# Patient Record
Sex: Female | Born: 1990 | Race: Black or African American | Hispanic: No | Marital: Single | Smoking: Never smoker
Health system: Southern US, Community
[De-identification: ages and names within clinical notes are randomized; demographics above are authoritative.]

## PROBLEM LIST (undated history)

## (undated) DIAGNOSIS — K589 Irritable bowel syndrome without diarrhea: Secondary | ICD-10-CM

---

## 2014-01-22 ENCOUNTER — Encounter (HOSPITAL_COMMUNITY): Payer: Self-pay | Admitting: Emergency Medicine

## 2014-01-22 ENCOUNTER — Emergency Department (HOSPITAL_COMMUNITY)
Admission: EM | Admit: 2014-01-22 | Discharge: 2014-01-22 | Disposition: A | Discharge status: Home / Self Care | Payer: BC Managed Care – PPO | Attending: Emergency Medicine | Admitting: Emergency Medicine

## 2014-01-22 ENCOUNTER — Other Ambulatory Visit: Payer: Self-pay

## 2014-01-22 DIAGNOSIS — R42 Dizziness and giddiness: Secondary | ICD-10-CM | POA: Diagnosis not present

## 2014-01-22 DIAGNOSIS — Z3202 Encounter for pregnancy test, result negative: Secondary | ICD-10-CM | POA: Insufficient documentation

## 2014-01-22 DIAGNOSIS — R110 Nausea: Secondary | ICD-10-CM | POA: Diagnosis not present

## 2014-01-22 DIAGNOSIS — R531 Weakness: Secondary | ICD-10-CM | POA: Insufficient documentation

## 2014-01-22 DIAGNOSIS — R51 Headache: Secondary | ICD-10-CM | POA: Diagnosis not present

## 2014-01-22 DIAGNOSIS — R55 Syncope and collapse: Secondary | ICD-10-CM | POA: Insufficient documentation

## 2014-01-22 LAB — I-STAT CHEM 8, ED
BUN: 14 mg/dL (ref 6–23)
Calcium, Ion: 1.2 mmol/L (ref 1.12–1.23)
Chloride: 104 mEq/L (ref 96–112)
Creatinine, Ser: 1 mg/dL (ref 0.50–1.10)
Glucose, Bld: 112 mg/dL — ABNORMAL HIGH (ref 70–99)
HCT: 40 % (ref 36.0–46.0)
Hemoglobin: 13.6 g/dL (ref 12.0–15.0)
Potassium: 3.8 mEq/L (ref 3.7–5.3)
Sodium: 140 mEq/L (ref 137–147)
TCO2: 24 mmol/L (ref 0–100)

## 2014-01-22 LAB — URINALYSIS, ROUTINE W REFLEX MICROSCOPIC
BILIRUBIN URINE: NEGATIVE
Glucose, UA: NEGATIVE mg/dL
Hgb urine dipstick: NEGATIVE
KETONES UR: NEGATIVE mg/dL
Leukocytes, UA: NEGATIVE
NITRITE: NEGATIVE
PH: 5.5 (ref 5.0–8.0)
Protein, ur: 100 mg/dL — AB
Specific Gravity, Urine: 1.029 (ref 1.005–1.030)
Urobilinogen, UA: 0.2 mg/dL (ref 0.0–1.0)

## 2014-01-22 LAB — URINE MICROSCOPIC-ADD ON

## 2014-01-22 LAB — POC URINE PREG, ED: Preg Test, Ur: NEGATIVE

## 2014-01-22 MED ORDER — SODIUM CHLORIDE 0.9 % IV BOLUS (SEPSIS)
1000.0000 mL | Freq: Once | INTRAVENOUS | Status: AC
  Administered 2014-01-22: 1000 mL via INTRAVENOUS

## 2014-01-22 MED ORDER — ONDANSETRON HCL 4 MG/2ML IJ SOLN
4.0000 mg | Freq: Once | INTRAMUSCULAR | Status: AC
  Administered 2014-01-22: 4 mg via INTRAVENOUS
  Filled 2014-01-22: qty 2

## 2014-01-22 MED ORDER — KETOROLAC TROMETHAMINE 30 MG/ML IJ SOLN
30.0000 mg | Freq: Once | INTRAMUSCULAR | Status: AC
  Administered 2014-01-22: 30 mg via INTRAVENOUS
  Filled 2014-01-22: qty 1

## 2014-01-22 NOTE — ED Notes (Signed)
Per EMS-Pt c/o dizziness near syncope at work today. States that she had the same symptoms x1 month. Home pregnancy test negative. Nausea no vomiting.

## 2014-01-22 NOTE — Discharge Instructions (Signed)
Near-Syncope Near-syncope (commonly known as near fainting) is sudden weakness, dizziness, or feeling like you might pass out. During an episode of near-syncope, you may also develop pale skin, have tunnel vision, or feel sick to your stomach (nauseous). Near-syncope may occur when getting up after sitting or while standing for a long time. It is caused by a sudden decrease in blood flow to the brain. This decrease can result from various causes or triggers, most of which are not serious. However, because near-syncope can sometimes be a sign of something serious, a medical evaluation is required. The specific cause is often not determined. HOME CARE INSTRUCTIONS  Monitor your condition for any changes. The following actions may help to alleviate any discomfort you are experiencing:  Have someone stay with you until you feel stable.  Lie down right away and prop your feet up if you start feeling like you might faint. Breathe deeply and steadily. Wait until all the symptoms have passed. Most of these episodes last only a few minutes. You may feel tired for several hours.   Drink enough fluids to keep your urine clear or pale yellow.   If you are taking blood pressure or heart medicine, get up slowly when seated or lying down. Take several minutes to sit and then stand. This can reduce dizziness.  Follow up with your health care provider as directed. SEEK IMMEDIATE MEDICAL CARE IF:   You have a severe headache.   You have unusual pain in the chest, abdomen, or back.   You are bleeding from the mouth or rectum, or you have black or tarry stool.   You have an irregular or very fast heartbeat.   You have repeated fainting or have seizure-like jerking during an episode.   You faint when sitting or lying down.   You have confusion.   You have difficulty walking.   You have severe weakness.   You have vision problems.  MAKE SURE YOU:   Understand these instructions.  Will  watch your condition.  Will get help right away if you are not doing well or get worse. Document Released: 03/04/2005 Document Revised: 03/09/2013 Document Reviewed: 08/07/2012 [employer] Patient Information 2015 [employer]. This information is not intended to replace advice given to you by your health care provider. Make sure you discuss any questions you have with your health care provider.  

## 2014-01-22 NOTE — ED Notes (Signed)
Bed: [id number] Expected date: 01/22/14 Expected time:  Means of arrival: Ambulance Comments: Weakenss; dizziness

## 2014-01-22 NOTE — ED Provider Notes (Signed)
CSN: [id number]     Arrival date & time 01/22/14  1718 History   First MD Initiated Contact with Patient 01/22/14 1731     Chief Complaint  Patient presents with  . Near Syncope     (Consider location/radiation/quality/duration/timing/severity/associated sxs/prior Treatment) HPI Pt is a 23yo female presenting to ED with c/o near syncope earlier today while she was at work. 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 Reports associated mild right sided aching headache, 4/10 at this time with associated nausea but no vomiting, fever or chills. Reports hx of similar episode last month but was not evaluated at that time. States she has not f/u with a PCP since the summer. States has naxplanon and does not normally have a menstrual cycle but did have one for 7 days last week which was "lighter than my cycles prior to being on naxplanon." denies hx of seizures, anemia or DM. Denies known heart problems including arrhythmias.    Pt denies new stress. States she has been eating and drinking well.  Pt does state she has never been a good sleeper and unsure how much sleep she gets at night. Denies use of caffeine, etoh, or recreational drugs.  History reviewed. No pertinent past medical history. History reviewed. No pertinent past surgical history. No family history on file. History  Substance Use Topics  . Smoking status: Never Smoker   . Smokeless tobacco: Not on file  . Alcohol Use: Yes     Comment: socially   OB History    No data available     Review of Systems  Constitutional: Negative for fever and chills.  Respiratory: Negative for cough and shortness of breath.   Cardiovascular: Negative for chest pain, palpitations and leg swelling.  Gastrointestinal: Negative for nausea, vomiting, abdominal pain and diarrhea.  Genitourinary: Negative for dysuria, hematuria, decreased urine volume,  vaginal bleeding, vaginal discharge, vaginal pain, menstrual problem and pelvic pain.  Musculoskeletal: Negative for myalgias and back pain.  Neurological: Positive for syncope ( near-syncope), weakness ( generalized), light-headedness and headaches. Negative for dizziness, seizures, facial asymmetry, speech difficulty and numbness.  All other systems reviewed and are negative.     Allergies  Review of patient's allergies indicates no known allergies.  Home Medications   Prior to Admission medications   Medication Sig Start Date End Date Taking? Authorizing Provider  Cholecalciferol (VITAMIN D PO) Take 1 capsule by mouth every 7 (seven) days.   Yes Historical Provider, MD  etonogestrel (NEXPLANON) 68 MG IMPL implant 1 each by Subdermal route once.   Yes Historical Provider, MD   BP 108/68 mmHg  Pulse 86  Temp(Src) 98.5 F (36.9 C) (Oral)  Resp 18  SpO2 100% Physical Exam  Constitutional: She is oriented to person, place, and time. She appears well-developed and well-nourished. No distress.  Pt sitting in exam bed, appears fatigued. NAD  HENT:  Head: Normocephalic and atraumatic.  Eyes: Conjunctivae and EOM are normal. Pupils are equal, round, and reactive to light. No scleral icterus.  Neck: Normal range of motion.  Cardiovascular: Normal rate, regular rhythm and normal heart sounds.   Regular rate and rhythm  Pulmonary/Chest: Effort normal and breath sounds normal. No respiratory distress. She has no wheezes. She has no rales. She exhibits no tenderness.  Abdominal: Soft. Bowel sounds are normal. She exhibits no distension and no mass. There is no tenderness.  There is no rebound and no guarding.  Musculoskeletal: Normal range of motion.  Neurological: She is alert and oriented to person, place, and time. She has normal strength. No cranial nerve deficit or sensory deficit. She displays a negative Romberg sign. Coordination and gait normal. GCS eye subscore is 4. GCS verbal  subscore is 5. GCS motor subscore is 6.  CN II-XII grossly in tact. Alert and oriented to person, place, and time. 5/5 strength in upper and lower extremities bilaterally. Normal finger to nose coordination. Normal gait.   Skin: Skin is warm and dry. She is not diaphoretic.  Nursing note and vitals reviewed.   ED Course  Procedures (including critical care time) Labs Review Labs Reviewed  URINALYSIS, ROUTINE W REFLEX MICROSCOPIC - Abnormal; Notable for the following:    Color, Urine AMBER (*)    Protein, ur 100 (*)    All other components within normal limits  I-STAT CHEM 8, ED - Abnormal; Notable for the following:    Glucose, Bld 112 (*)    All other components within normal limits  URINE MICROSCOPIC-ADD ON  POC URINE PREG, ED    Imaging Review No results found.   EKG Interpretation   Date/Time:  Saturday January 22 2014 17:16:58 EST Ventricular Rate:  78 PR Interval:  162 QRS Duration: 71 QT Interval:  366 QTC Calculation: 417 R Axis:   35 Text Interpretation:  Sinus rhythm Abnormal R-wave progression, early  transition No old tracing to compare Confirmed by KNAPP  MD-J, JON ([id number])  on 01/22/2014 7:07:01 PM      MDM   Final diagnoses:  Near syncope    Pt presenting to ED with c/o near-syncope episodes earlier today at work. Reports hx of similar episode w/o medical evaluation of previous episode.  Denies chest pain, palpitations or SOB. Reports headache. No respiratory distress, heart- regular rate and rhythm.  Pt appears well, non-toxic. Vitals: WNL.  Tx in ED: fluids, toradol, and zofran. HA improved from 10/10 to 4/10.  Labs: unremarakble. No evidence of anemia or electrolyte imbalance. Not concerned for [facility], CVA, arrhythmia, seizure, or other emergent process taking place at this time. Pt appears well. Will discharge home.     Junius Finnerrin O'Malley, PA-C 01/22/14 2014  Linwood DibblesJon Knapp, MD 01/23/14 [phone]

## 2014-03-04 ENCOUNTER — Ambulatory Visit (INDEPENDENT_AMBULATORY_CARE_PROVIDER_SITE_OTHER): Payer: BC Managed Care – PPO | Admitting: Physician Assistant

## 2014-03-04 VITALS — BP 110/78 | HR 94 | Temp 98.6°F | Resp 16 | Ht 64.0 in | Wt 146.8 lb

## 2014-03-04 DIAGNOSIS — R300 Dysuria: Secondary | ICD-10-CM

## 2014-03-04 LAB — POCT WET PREP WITH KOH
CLUE CELLS WET PREP PER HPF POC: NEGATIVE
KOH Prep POC: NEGATIVE
TRICHOMONAS UA: NEGATIVE
YEAST WET PREP PER HPF POC: NEGATIVE

## 2014-03-04 LAB — POCT UA - MICROSCOPIC ONLY
Casts, Ur, LPF, POC: NEGATIVE
Crystals, Ur, HPF, POC: NEGATIVE
Mucus, UA: NEGATIVE
Yeast, UA: NEGATIVE

## 2014-03-04 LAB — POCT URINALYSIS DIPSTICK
Bilirubin, UA: NEGATIVE
GLUCOSE UA: NEGATIVE
Ketones, UA: NEGATIVE
NITRITE UA: NEGATIVE
Protein, UA: NEGATIVE
Spec Grav, UA: <=1.005
Urobilinogen, UA: 0.2
pH, UA: 5.5

## 2014-03-04 MED ORDER — PHENAZOPYRIDINE HCL 200 MG PO TABS: 200.0000 mg | ORAL_TABLET | Freq: Three times a day (TID) | ORAL | Status: DC | PRN

## 2014-03-04 NOTE — Progress Notes (Signed)
Subjective:    Patient ID: Molly Schultz, female    DOB: 07-31-1990, 23 y.o.   MRN: [id number]  HPI Patient presents for 4 day of burning and pain with urination. Drinks a lot of water so does not think there is increased frequency, however, complains of urgency and incomplete emptying. Denies fever, back/flank/abdomenal pain, but has pain near urethra without urination. Is sexually active with monogamous female partner and do not use condoms. Denies dyspareunia. Was checked for STDs 2 months ago and does not wish to have testing today. Female partner was tested at that time. Has normal pap this past summer and does not want a pelvic exam at this time. LMP 02/26/14 and was regular. Denies N/V or D/C. Thought she had a yeast infection initially and has been taking Monistat 3 day and has completed regimen. Taking Azo and drinking cranberry without improvement of sx.  No med allergies.  Review of Systems  Constitutional: Negative for fever and chills.  Gastrointestinal: Negative for nausea, vomiting, abdominal pain, diarrhea and constipation.  Genitourinary: Positive for dysuria, urgency and difficulty urinating. Negative for frequency, hematuria, flank pain, decreased urine volume, vaginal bleeding, vaginal discharge, vaginal pain, menstrual problem, pelvic pain and dyspareunia.  Musculoskeletal: Negative for back pain.  Allergic/Immunologic: Negative for environmental allergies and food allergies.       Objective:   Physical Exam  Constitutional: She is oriented to person, place, and time. She appears well-developed and well-nourished. No distress.  Blood pressure 110/78, pulse 94, temperature 98.6 F (37 C), temperature source Oral, resp. rate 16, height 5\' 4"  (1.626 m), weight 146 lb 12.8 oz (66.588 kg), last menstrual period 03/04/2014, SpO2 100 %.  HENT:  Head: Normocephalic and atraumatic.  Right Ear: External ear normal.  Left Ear: External ear normal.  Cardiovascular: Normal rate,  regular rhythm and normal heart sounds.  Exam reveals no gallop and no friction rub.   No murmur heard. Pulmonary/Chest: Effort normal and breath sounds normal. She has no wheezes. She has no rales.  Abdominal: Soft. Bowel sounds are normal. She exhibits no distension and no mass. There is no tenderness. There is no rebound, no guarding and no CVA tenderness. No hernia.  Neurological: She is alert and oriented to person, place, and time.  Skin: Skin is warm and dry. No rash noted. She is not diaphoretic. No erythema. No pallor.   Results for orders placed or performed in visit on 03/04/14  POCT urinalysis dipstick  Result Value Ref Range   Color, UA light yellow    Clarity, UA slightly cloudy    Glucose, UA neg    Bilirubin, UA neg    Ketones, UA neg    Spec Grav, UA <=1.005    Blood, UA small    pH, UA 5.5    Protein, UA neg    Urobilinogen, UA 0.2    Nitrite, UA neg    Leukocytes, UA moderate (2+)   POCT UA - Microscopic Only  Result Value Ref Range   WBC, Ur, HPF, POC 0-3    RBC, urine, microscopic 1-4    Bacteria, U Microscopic trace    Mucus, UA neg    Epithelial cells, urine per micros 0-2    Crystals, Ur, HPF, POC neg    Casts, Ur, LPF, POC neg    Yeast, UA neg   POCT Wet Prep with KOH  Result Value Ref Range   Trichomonas, UA Negative    Clue Cells Wet Prep HPF POC neg  Epithelial Wet Prep HPF POC 3-12    Yeast Wet Prep HPF POC neg    Bacteria Wet Prep HPF POC trace    RBC Wet Prep HPF POC 3-6    WBC Wet Prep HPF POC 0-4    KOH Prep POC Negative        Assessment & Plan:  1. Dysuria Wet prep and UA neg. Will just tx sx until urine culture returns. - POCT urinalysis dipstick - POCT UA - Microscopic Only - POCT Wet Prep with KOH - Urine culture - phenazopyridine (PYRIDIUM) 200 MG tablet; Take 1 tablet (200 mg total) by mouth 3 (three) times daily as needed for pain.  Dispense: 10 tablet; Refill: 0   Amberleigh Gerken PA-C  [facility] 03/04/2014 7:01 PM

## 2014-03-05 ENCOUNTER — Telehealth: Payer: Self-pay

## 2014-03-05 NOTE — Telephone Encounter (Signed)
Patient called stated she was seen yesterday. Patient is requesting for an antibiotic for her UTI. She is having pain and discomfort. She feel the pain is getting worse. [facility] [address] Please contact patient at [phone]. Patient stated the number we have on file is disconnected for right now.

## 2014-03-05 NOTE — Progress Notes (Signed)
Tishira- I would likely go ahead and start this pt on tx for UTI while her culture is pending.  Her urine is quite dilute so the UA and micro may not be as helpful but she does have nitrites and some white/ red cells.   It is your call but at the least be sure to follow-up with her closely with her culture results and maybe check on her tomorrow.   Thanks! JC

## 2014-03-06 NOTE — Telephone Encounter (Signed)
Wants us to know that the pain getting worse

## 2014-03-07 MED ORDER — SULFAMETHOXAZOLE-TRIMETHOPRIM 800-160 MG PO TABS: 1.0000 | ORAL_TABLET | Freq: Two times a day (BID) | ORAL | Status: DC

## 2014-03-07 NOTE — Telephone Encounter (Signed)
Pt requesting medication for pain and discomfort. Please advise

## 2014-03-07 NOTE — Telephone Encounter (Signed)
Urine culture confirms UTI, but sensitivities are not yet available. Start empiric therapy with Septra DS.  Meds ordered this encounter  Medications  . sulfamethoxazole-trimethoprim (BACTRIM DS,SEPTRA DS) 800-160 MG per tablet    Sig: Take 1 tablet by mouth 2 (two) times daily.    Dispense:  10 tablet    Refill:  0    Order Specific Question:  Supervising Provider    Answer:  DOOLITTLE, ROBERT P [[id number]]

## 2014-03-08 LAB — URINE CULTURE: Colony Count: >=100000

## 2014-03-08 NOTE — Telephone Encounter (Signed)
LM on cell Abx sent to pharmacy.

## 2014-03-08 NOTE — Telephone Encounter (Signed)
Pt had called back, didn't get her message, and LM on VM to call her back. I called pt and advised Rx is at [facility] [address].

## 2014-03-12 ENCOUNTER — Telehealth: Payer: Self-pay

## 2014-03-12 NOTE — Telephone Encounter (Signed)
Pt was prescribed bactrim 12/18 and is still not feeling any better. She wants to know what can be done about her persistent symptoms.

## 2014-03-15 NOTE — Telephone Encounter (Signed)
Pt needs to RTC Tried to reach pt- phone is disconnected. Please get number for pt when call is rtn

## 2014-03-15 NOTE — Telephone Encounter (Signed)
Pt needs to RTC.

## 2014-08-18 ENCOUNTER — Ambulatory Visit: Payer: BLUE CROSS/BLUE SHIELD

## 2014-10-13 ENCOUNTER — Emergency Department (HOSPITAL_COMMUNITY)
Admission: EM | Admit: 2014-10-13 | Discharge: 2014-10-13 | Disposition: A | Discharge status: Home / Self Care | Payer: BLUE CROSS/BLUE SHIELD | Attending: Emergency Medicine | Admitting: Emergency Medicine

## 2014-10-13 ENCOUNTER — Emergency Department (HOSPITAL_COMMUNITY): Payer: BLUE CROSS/BLUE SHIELD

## 2014-10-13 ENCOUNTER — Encounter (HOSPITAL_COMMUNITY): Payer: Self-pay | Admitting: Neurology

## 2014-10-13 DIAGNOSIS — S0990XA Unspecified injury of head, initial encounter: Secondary | ICD-10-CM

## 2014-10-13 DIAGNOSIS — Y9389 Activity, other specified: Secondary | ICD-10-CM | POA: Insufficient documentation

## 2014-10-13 DIAGNOSIS — S060X0A Concussion without loss of consciousness, initial encounter: Secondary | ICD-10-CM | POA: Insufficient documentation

## 2014-10-13 DIAGNOSIS — R112 Nausea with vomiting, unspecified: Secondary | ICD-10-CM | POA: Insufficient documentation

## 2014-10-13 DIAGNOSIS — Y998 Other external cause status: Secondary | ICD-10-CM | POA: Insufficient documentation

## 2014-10-13 DIAGNOSIS — W01198A Fall on same level from slipping, tripping and stumbling with subsequent striking against other object, initial encounter: Secondary | ICD-10-CM | POA: Diagnosis not present

## 2014-10-13 DIAGNOSIS — Z792 Long term (current) use of antibiotics: Secondary | ICD-10-CM | POA: Insufficient documentation

## 2014-10-13 DIAGNOSIS — Y92009 Unspecified place in unspecified non-institutional (private) residence as the place of occurrence of the external cause: Secondary | ICD-10-CM | POA: Insufficient documentation

## 2014-10-13 MED ORDER — KETOROLAC TROMETHAMINE 30 MG/ML IJ SOLN
30.0000 mg | Freq: Once | INTRAMUSCULAR | Status: AC
  Administered 2014-10-13: 30 mg via INTRAVENOUS
  Filled 2014-10-13: qty 1

## 2014-10-13 MED ORDER — FENTANYL CITRATE (PF) 100 MCG/2ML IJ SOLN
25.0000 ug | Freq: Once | INTRAMUSCULAR | Status: AC
  Administered 2014-10-13: 25 ug via INTRAVENOUS
  Filled 2014-10-13: qty 2

## 2014-10-13 MED ORDER — ONDANSETRON HCL 4 MG/2ML IJ SOLN
4.0000 mg | Freq: Once | INTRAMUSCULAR | Status: AC
  Administered 2014-10-13: 4 mg via INTRAVENOUS
  Filled 2014-10-13: qty 2

## 2014-10-13 MED ORDER — TRAMADOL HCL 50 MG PO TABS: 50.0000 mg | ORAL_TABLET | Freq: Four times a day (QID) | ORAL | Status: DC | PRN

## 2014-10-13 MED ORDER — ONDANSETRON HCL 4 MG PO TABS: 4.0000 mg | ORAL_TABLET | Freq: Three times a day (TID) | ORAL | Status: DC | PRN

## 2014-10-13 NOTE — Discharge Instructions (Signed)
Concussion  A concussion, or closed-head injury, is a brain injury caused by a direct blow to the head or by a quick and sudden movement (jolt) of the head or neck. Concussions are usually not life-threatening. Even so, the effects of a concussion can be serious. If you have had a concussion before, you are more likely to experience concussion-like symptoms after a direct blow to the head.   CAUSES  · Direct blow to the head, such as from running into another player during a soccer game, being hit in a fight, or hitting your head on a hard surface.  · A jolt of the head or neck that causes the brain to move back and forth inside the skull, such as in a car crash.  SIGNS AND SYMPTOMS  The signs of a concussion can be hard to notice. Early on, they may be missed by you, family members, and health care providers. You may look fine but act or feel differently.  Symptoms are usually temporary, but they may last for days, weeks, or even longer. Some symptoms may appear right away while others may not show up for hours or days. Every head injury is different. Symptoms include:  · Mild to moderate headaches that will not go away.  · A feeling of pressure inside your head.  · Having more trouble than usual:  ¨ Learning or remembering things you have heard.  ¨ Answering questions.  ¨ Paying attention or concentrating.  ¨ Organizing daily tasks.  ¨ Making decisions and solving problems.  · Slowness in thinking, acting or reacting, speaking, or reading.  · Getting lost or being easily confused.  · Feeling tired all the time or lacking energy (fatigued).  · Feeling drowsy.  · Sleep disturbances.  ¨ Sleeping more than usual.  ¨ Sleeping less than usual.  ¨ Trouble falling asleep.  ¨ Trouble sleeping (insomnia).  · Loss of balance or feeling lightheaded or dizzy.  · Nausea or vomiting.  · Numbness or tingling.  · Increased sensitivity to:  ¨ Sounds.  ¨ Lights.  ¨ Distractions.  · Vision problems or eyes that tire  easily.  · Diminished sense of taste or smell.  · Ringing in the ears.  · Mood changes such as feeling sad or anxious.  · Becoming easily irritated or angry for little or no reason.  · Lack of motivation.  · Seeing or hearing things other people do not see or hear (hallucinations).  DIAGNOSIS  Your health care provider can usually diagnose a concussion based on a description of your injury and symptoms. He or she will ask whether you passed out (lost consciousness) and whether you are having trouble remembering events that happened right before and during your injury.  Your evaluation might include:  · A brain scan to look for signs of injury to the brain. Even if the test shows no injury, you may still have a concussion.  · Blood tests to be sure other problems are not present.  TREATMENT  · Concussions are usually treated in an emergency department, in urgent care, or at a clinic. You may need to stay in the hospital overnight for further treatment.  · Tell your health care provider if you are taking any medicines, including prescription medicines, over-the-counter medicines, and natural remedies. Some medicines, such as blood thinners (anticoagulants) and aspirin, may increase the chance of complications. Also tell your health care provider whether you have had alcohol or are taking illegal drugs. This information   may affect treatment.  · Your health care provider will send you home with important instructions to follow.  · How fast you will recover from a concussion depends on many factors. These factors include how severe your concussion is, what part of your brain was injured, your age, and how healthy you were before the concussion.  · Most people with mild injuries recover fully. Recovery can take time. In general, recovery is slower in older persons. Also, persons who have had a concussion in the past or have other medical problems may find that it takes longer to recover from their current injury.  HOME  CARE INSTRUCTIONS  General Instructions  · Carefully follow the directions your health care provider gave you.  · Only take over-the-counter or prescription medicines for pain, discomfort, or fever as directed by your health care provider.  · Take only those medicines that your health care provider has approved.  · Do not drink alcohol until your health care provider says you are well enough to do so. Alcohol and certain other drugs may slow your recovery and can put you at risk of further injury.  · If it is harder than usual to remember things, write them down.  · If you are easily distracted, try to do one thing at a time. For example, do not try to watch TV while fixing dinner.  · Talk with family members or close friends when making important decisions.  · Keep all follow-up appointments. Repeated evaluation of your symptoms is recommended for your recovery.  · Watch your symptoms and tell others to do the same. Complications sometimes occur after a concussion. Older adults with a brain injury may have a higher risk of serious complications, such as a blood clot on the brain.  · Tell your teachers, school nurse, school counselor, coach, athletic trainer, or work manager about your injury, symptoms, and restrictions. Tell them about what you can or cannot do. They should watch for:  ¨ Increased problems with attention or concentration.  ¨ Increased difficulty remembering or learning new information.  ¨ Increased time needed to complete tasks or assignments.  ¨ Increased irritability or decreased ability to cope with stress.  ¨ Increased symptoms.  · Rest. Rest helps the brain to heal. Make sure you:  ¨ Get plenty of sleep at night. Avoid staying up late at night.  ¨ Keep the same bedtime hours on weekends and weekdays.  ¨ Rest during the day. Take daytime naps or rest breaks when you feel tired.  · Limit activities that require a lot of thought or concentration. These include:  ¨ Doing homework or job-related  work.  ¨ Watching TV.  ¨ Working on the computer.  · Avoid any situation where there is potential for another head injury (football, hockey, soccer, basketball, martial arts, downhill snow sports and horseback riding). Your condition will get worse every time you experience a concussion. You should avoid these activities until you are evaluated by the appropriate follow-up health care providers.  Returning To Your Regular Activities  You will need to return to your normal activities slowly, not all at once. You must give your body and brain enough time for recovery.  · Do not return to sports or other athletic activities until your health care provider tells you it is safe to do so.  · Ask your health care provider when you can drive, ride a bicycle, or operate heavy machinery. Your ability to react may be slower after a   brain injury. Never do these activities if you are dizzy.  · Ask your health care provider about when you can return to work or school.  Preventing Another Concussion  It is very important to avoid another brain injury, especially before you have recovered. In rare cases, another injury can lead to permanent brain damage, brain swelling, or death. The risk of this is greatest during the first 7-10 days after a head injury. Avoid injuries by:  · Wearing a seat belt when riding in a car.  · Drinking alcohol only in moderation.  · Wearing a helmet when biking, skiing, skateboarding, skating, or doing similar activities.  · Avoiding activities that could lead to a second concussion, such as contact or recreational sports, until your health care provider says it is okay.  · Taking safety measures in your home.  ¨ Remove clutter and tripping hazards from floors and stairways.  ¨ Use grab bars in bathrooms and handrails by stairs.  ¨ Place non-slip mats on floors and in bathtubs.  ¨ Improve lighting in dim areas.  SEEK MEDICAL CARE IF:  · You have increased problems paying attention or  concentrating.  · You have increased difficulty remembering or learning new information.  · You need more time to complete tasks or assignments than before.  · You have increased irritability or decreased ability to cope with stress.  · You have more symptoms than before.  Seek medical care if you have any of the following symptoms for more than 2 weeks after your injury:  · Lasting (chronic) headaches.  · Dizziness or balance problems.  · Nausea.  · Vision problems.  · Increased sensitivity to noise or light.  · Depression or mood swings.  · Anxiety or irritability.  · Memory problems.  · Difficulty concentrating or paying attention.  · Sleep problems.  · Feeling tired all the time.  SEEK IMMEDIATE MEDICAL CARE IF:  · You have severe or worsening headaches. These may be a sign of a blood clot in the brain.  · You have weakness (even if only in one hand, leg, or part of the face).  · You have numbness.  · You have decreased coordination.  · You vomit repeatedly.  · You have increased sleepiness.  · One pupil is larger than the other.  · You have convulsions.  · You have slurred speech.  · You have increased confusion. This may be a sign of a blood clot in the brain.  · You have increased restlessness, agitation, or irritability.  · You are unable to recognize people or places.  · You have neck pain.  · It is difficult to wake you up.  · You have unusual behavior changes.  · You lose consciousness.  MAKE SURE YOU:  · Understand these instructions.  · Will watch your condition.  · Will get help right away if you are not doing well or get worse.  Document Released: 05/25/2003 Document Revised: 03/09/2013 Document Reviewed: 09/24/2012  [employer]® Patient Information ©2015 [employer]. This information is not intended to replace advice given to you by your health care provider. Make sure you discuss any questions you have with your health care provider.

## 2014-10-13 NOTE — ED Provider Notes (Signed)
CSN: [id number]     Arrival date & time 10/13/14  0910 History   First MD Initiated Contact with Patient 10/13/14 0919     Chief Complaint  Patient presents with  . Fall     (Consider location/radiation/quality/duration/timing/severity/associated sxs/prior Treatment) HPI Comments: Molly Schultz, 24 y/o female presents after a fall. She fell two evenings ago and complains of headache, dizziness, nausea, and vomiting. When she fell, she hit the right occipital region of her head on the wall and then fell to the ground. The headache began 30 minutes after the fall. The nausea and vomiting began yesterday. Her headache ranges from an 8/10 to currently a 5/10 and is waxing and waning. Her significant other states that she complains of headaches rather regularly. She had numbness and tingling of her lower extremities last night that resolved after she vomited.  She has a sore throat that began before she fell. She denies phonophobia, weakness, dysphagia, receptive or expressive aphasia, any other injuries, fevers, or chills.  Patient is a 24 y.o. female presenting with fall. The history is provided by the patient.  Fall This is a new problem. The current episode started in the past 7 days. Associated symptoms include headaches, nausea and vomiting. Pertinent negatives include no chills, fever, numbness, visual change or weakness.    History reviewed. No pertinent past medical history. History reviewed. No pertinent past surgical history. No family history on file. History  Substance Use Topics  . Smoking status: Never Smoker   . Smokeless tobacco: Never Used  . Alcohol Use: Yes     Comment: socially   OB History    No data available     Review of Systems  Constitutional: Negative for fever and chills.  Gastrointestinal: Positive for nausea and vomiting.  Neurological: Positive for headaches. Negative for weakness and numbness.  All other systems reviewed and are  negative.     Allergies  Review of patient's allergies indicates no known allergies.  Home Medications   Prior to Admission medications   Medication Sig Start Date End Date Taking? Authorizing Provider  Cholecalciferol (VITAMIN D PO) Take 1 capsule by mouth every 7 (seven) days.    Historical Provider, MD  etonogestrel (NEXPLANON) 68 MG IMPL implant 1 each by Subdermal route once.    Historical Provider, MD  phenazopyridine (PYRIDIUM) 200 MG tablet Take 1 tablet (200 mg total) by mouth 3 (three) times daily as needed for pain. 03/04/14   Tishira R Brewington, PA-C  sulfamethoxazole-trimethoprim (BACTRIM DS,SEPTRA DS) 800-160 MG per tablet Take 1 tablet by mouth 2 (two) times daily. 03/07/14   Chelle Jeffery, PA-C   BP 109/76 mmHg  Pulse 88  Temp(Src) 98.5 F (36.9 C) (Oral)  Resp 16  SpO2 100%  LMP 09/22/2014 Physical Exam  Constitutional: She is oriented to person, place, and time. She appears well-developed and well-nourished. No distress.  HENT:  Head: Normocephalic and atraumatic.  Mouth/Throat: Oropharynx is clear and moist.  Eyes: Conjunctivae and EOM are normal. Pupils are equal, round, and reactive to light. No scleral icterus.  No horizontal, vertical or rotational nystagmus  Neck: Normal range of motion. Neck supple.  Full active and passive ROM without pain No midline or paraspinal tenderness No nuchal rigidity or meningeal signs  Cardiovascular: Normal rate, regular rhythm and intact distal pulses.   Pulmonary/Chest: Effort normal and breath sounds normal. No respiratory distress. She has no wheezes. She has no rales.  Abdominal: Soft. Bowel sounds are normal. There is no tenderness.  There is no rebound and no guarding.  Musculoskeletal: Normal range of motion.  Lymphadenopathy:    She has no cervical adenopathy.  Neurological: She is alert and oriented to person, place, and time. She displays normal reflexes. No cranial nerve deficit. She exhibits normal muscle  tone. Coordination normal.  Mental Status:  Alert, oriented, thought content appropriate. Speech fluent without evidence of aphasia. Able to follow 2 step commands without difficulty.  Cranial Nerves:  II:  Peripheral visual fields grossly normal, pupils equal, round, reactive to light III,IV, VI: ptosis not present, extra-ocular motions intact bilaterally  V,VII: smile symmetric, facial light touch sensation equal VIII: hearing grossly normal bilaterally  IX,X: gag reflex present  XI: bilateral shoulder shrug equal and strong XII: midline tongue extension  Motor:  5/5 in upper and lower extremities bilaterally including strong and equal grip strength and dorsiflexion/plantar flexion Sensory: Pinprick and light touch normal in all extremities.  Deep Tendon Reflexes: 2+ and symmetric  Cerebellar: normal finger-to-nose with bilateral upper extremities Gait: normal gait and balance CV: distal pulses palpable throughout   Skin: Skin is warm and dry. No rash noted. She is not diaphoretic.  Psychiatric: She has a normal mood and affect. Her behavior is normal. Judgment and thought content normal.  Nursing note and vitals reviewed.   ED Course  Procedures (including critical care time) Labs Review Labs Reviewed - No data to display  Imaging Review No results found.   EKG Interpretation None      MDM   Final diagnoses:  Head injury    10:43 AM BP 102/76 mmHg  Pulse 77  Temp(Src) 98.5 F (36.9 C) (Oral)  Resp 16  SpO2 99%  LMP 09/22/2014 Patient CT scan negative for acute intracranial abnormality. Her symptoms likely reflect concussion. Patient will be discharged with tramadol and Zofran.s. Pt is afebrile with no focal neuro deficits, nuchal rigidity, or change in vision. Pt is to follow up with PCP. Pt verbalizes understanding and is agreeable with plan to dc.    Arthor Captain, PA-C 10/13/14 1045  Nelva Nay, MD 10/13/14 [phone]

## 2014-10-13 NOTE — ED Notes (Signed)
Per ems- pt comes from home on Tuesday she fell getting out of the shower and hit right side of her head. Since then she has felt dizzy and vomiting since yesterday. Has c-collar in place. Given 4 mg zofran. BP 113/78, HR 90 SR, CBG 80. Is a x 4

## 2015-11-15 ENCOUNTER — Telehealth: Payer: Self-pay

## 2015-11-15 NOTE — Telephone Encounter (Signed)
Pt would like a CB concerning her nexplanon implant. She would like someone here to remove it. Please advise at [phone]

## 2015-11-16 ENCOUNTER — Encounter: Payer: Self-pay | Admitting: Physician Assistant

## 2015-11-16 ENCOUNTER — Ambulatory Visit (INDEPENDENT_AMBULATORY_CARE_PROVIDER_SITE_OTHER): Payer: PRIVATE HEALTH INSURANCE | Admitting: Physician Assistant

## 2015-11-16 VITALS — BP 104/80 | HR 92 | Temp 98.4°F | Resp 16 | Ht 64.0 in | Wt 155.2 lb

## 2015-11-16 DIAGNOSIS — N912 Amenorrhea, unspecified: Secondary | ICD-10-CM | POA: Diagnosis not present

## 2015-11-16 DIAGNOSIS — Z30011 Encounter for initial prescription of contraceptive pills: Secondary | ICD-10-CM | POA: Diagnosis not present

## 2015-11-16 DIAGNOSIS — Z3046 Encounter for surveillance of implantable subdermal contraceptive: Secondary | ICD-10-CM | POA: Diagnosis not present

## 2015-11-16 LAB — POCT URINE PREGNANCY: Preg Test, Ur: NEGATIVE

## 2015-11-16 MED ORDER — LEVONORGESTREL-ETHINYL ESTRAD 0.1-20 MG-MCG PO TABS
1.0000 | ORAL_TABLET | Freq: Every day | ORAL | 0 refills | Status: DC
Start: 2015-11-16 — End: 2017-06-03

## 2015-11-16 NOTE — Patient Instructions (Addendum)
  Recheck with me in a 2 week for a repeat pregnancy test wice we are doing quick start for pills without you being on your pill.   IF you received an x-ray today, [facility] will [facility]. Please contact [facility] at [phone] with questions or concerns regarding your invoice.   IF you received labwork today, you will receive an invoice from [facility]. Please contact [facility] at [phone] with questions or concerns regarding your invoice.   Our billing staff will not be able to assist you with questions regarding bills from these companies.  You will be contacted with the lab results as soon as they are available. The fastest way to get your results is to activate your My Chart account. Instructions are located on the last page of this paperwork. If you have not heard from us regarding the results in 2 weeks, please contact this office.

## 2015-11-16 NOTE — Progress Notes (Signed)
   Molly Schultz  MRN: [id number] DOB: 1990-10-23  Subjective:  Pt presents to clinic for nexplanon removal - it was due to come out 08/13/2015 - she had trouble with her insurance so she was unable to get it out.  She has been feeling terrible with nausea and upset stomach and she thinks it might be from the nexplanon.  She has been sexually active without other protection.  She would like to have another form of birth control but she does not want what she has tried in the past (Depoprovera and Nexplanon) she would like to try OCP -- she knows she wants to get pregnant but she is not sure when.  She is unsure when her last pap smear was. LMP 1.5 weeks ago  Review of Systems  Constitutional: Negative for chills and fever.    There are no active problems to display for this patient.   Current Outpatient Prescriptions on File Prior to Visit  Medication Sig Dispense Refill  . Cholecalciferol (VITAMIN D PO) Take 1 capsule by mouth every 7 (seven) days.    . ondansetron (ZOFRAN) 4 MG tablet Take 1 tablet (4 mg total) by mouth every 8 (eight) hours as needed for nausea or vomiting. (Patient not taking: Reported on 11/16/2015) 10 tablet 0  . phenazopyridine (PYRIDIUM) 200 MG tablet Take 1 tablet (200 mg total) by mouth 3 (three) times daily as needed for pain. (Patient not taking: Reported on 11/16/2015) 10 tablet 0  . traMADol (ULTRAM) 50 MG tablet Take 1 tablet (50 mg total) by mouth every 6 (six) hours as needed. (Patient not taking: Reported on 11/16/2015) 15 tablet 0   No current facility-administered medications on file prior to visit.     No Known Allergies  Pt patients past, family and social history were reviewed and updated.  Objective:  BP 104/80 (BP Location: Right Arm, Patient Position: Sitting, Cuff Size: Normal)   Pulse 92   Temp 98.4 F (36.9 C) (Oral)   Resp 16   Ht 5\' 4"  (1.626 m)   Wt 155 lb 3.2 oz (70.4 kg)   LMP 10/30/2015   SpO2 100%   BMI 26.64 kg/m   Physical  Exam  Constitutional: She is oriented to person, place, and time and well-developed, well-nourished, and in no distress.  HENT:  Head: Normocephalic and atraumatic.  Right Ear: Hearing and external ear normal.  Left Ear: Hearing and external ear normal.  Eyes: Conjunctivae are normal.  Neck: Normal range of motion.  Pulmonary/Chest: Effort normal.  Neurological: She is alert and oriented to person, place, and time. Gait normal.  Skin: Skin is warm and dry.  Psychiatric: Mood, memory, affect and judgment normal.  Vitals reviewed.  Procedure: Consent obtained.  Local anesthesia with 1% lido with epi.  Betadine prep.  #11 blade used to make a 1/2 cm incision and nexplanon removed without problems. Steristrip placed and pressure drsg.  Results for orders placed or performed in visit on 11/16/15  POCT urine pregnancy  Result Value Ref Range   Preg Test, Ur Negative Negative    Assessment and Plan :  Amenorrhea - Plan: POCT urine pregnancy  Encounter for initial prescription of contraceptive pills - Plan: levonorgestrel-ethinyl estradiol (AVIANE) 0.1-20 MG-MCG tablet - quick start today with OCP and recheck with me in 2 weeks to continue RF of OCP  Nexplanon removal - removed without problems  Benny LennertSarah Gaylin Osoria PA-C  [facility] 11/16/2015 7:01 PM

## 2015-11-17 NOTE — Telephone Encounter (Signed)
I know we implant them but I called pt and she states she doesn't have a question

## 2016-07-03 ENCOUNTER — Ambulatory Visit (INDEPENDENT_AMBULATORY_CARE_PROVIDER_SITE_OTHER): Payer: PRIVATE HEALTH INSURANCE | Admitting: Emergency Medicine

## 2016-07-03 VITALS — BP 110/71 | HR 89 | Temp 99.1°F | Resp 18 | Ht 64.0 in | Wt 146.0 lb

## 2016-07-03 DIAGNOSIS — R112 Nausea with vomiting, unspecified: Secondary | ICD-10-CM | POA: Diagnosis not present

## 2016-07-03 DIAGNOSIS — R1084 Generalized abdominal pain: Secondary | ICD-10-CM | POA: Diagnosis not present

## 2016-07-03 LAB — CBC WITH DIFFERENTIAL/PLATELET
Basophils Absolute: 0 10*3/uL (ref 0.0–0.2)
Basos: 0 %
EOS (ABSOLUTE): 0 10*3/uL (ref 0.0–0.4)
Eos: 0 %
HEMATOCRIT: 40.2 % (ref 34.0–46.6)
Hemoglobin: 12.6 g/dL (ref 11.1–15.9)
IMMATURE GRANS (ABS): 0 10*3/uL (ref 0.0–0.1)
Immature Granulocytes: 0 %
LYMPHS ABS: 1.7 10*3/uL (ref 0.7–3.1)
Lymphs: 24 %
MCH: 30.2 pg (ref 26.6–33.0)
MCHC: 31.3 g/dL — AB (ref 31.5–35.7)
MCV: 96 fL (ref 79–97)
Monocytes Absolute: 0.2 10*3/uL (ref 0.1–0.9)
Monocytes: 4 %
NEUTROS ABS: 4.9 10*3/uL (ref 1.4–7.0)
Neutrophils: 72 %
Platelets: 371 10*3/uL (ref 150–379)
RBC: 4.17 x10E6/uL (ref 3.77–5.28)
RDW: 14.3 % (ref 12.3–15.4)
WBC: 6.9 10*3/uL (ref 3.4–10.8)

## 2016-07-03 LAB — COMPREHENSIVE METABOLIC PANEL
ALT: 12 IU/L (ref 0–32)
AST: 14 IU/L (ref 0–40)
Albumin/Globulin Ratio: 1.8 (ref 1.2–2.2)
Albumin: 4.8 g/dL (ref 3.5–5.5)
Alkaline Phosphatase: 43 IU/L (ref 39–117)
BUN/Creatinine Ratio: 10 (ref 9–23)
BUN: 8 mg/dL (ref 6–20)
Bilirubin Total: 0.3 mg/dL (ref 0.0–1.2)
CO2: 35 mmol/L — ABNORMAL HIGH (ref 18–29)
Calcium: 9.7 mg/dL (ref 8.7–10.2)
Chloride: 100 mmol/L (ref 96–106)
Creatinine, Ser: 0.81 mg/dL (ref 0.57–1.00)
GFR calc non Af Amer: 102 mL/min/{1.73_m2} (ref >59)
GFR, EST AFRICAN AMERICAN: 118 mL/min/{1.73_m2} (ref >59)
GLOBULIN, TOTAL: 2.7 g/dL (ref 1.5–4.5)
Glucose: 93 mg/dL (ref 65–99)
POTASSIUM: 4.3 mmol/L (ref 3.5–5.2)
Sodium: 140 mmol/L (ref 134–144)
TOTAL PROTEIN: 7.5 g/dL (ref 6.0–8.5)

## 2016-07-03 LAB — POCT URINALYSIS DIP (MANUAL ENTRY)
BILIRUBIN UA: NEGATIVE mg/dL
Bilirubin, UA: NEGATIVE
Glucose, UA: NEGATIVE mg/dL
Leukocytes, UA: NEGATIVE
Nitrite, UA: NEGATIVE
PH UA: 6 (ref 5.0–8.0)
PROTEIN UA: NEGATIVE mg/dL
RBC UA: NEGATIVE
SPEC GRAV UA: 1.01 (ref 1.010–1.025)
Urobilinogen, UA: 0.2 E.U./dL

## 2016-07-03 LAB — POCT URINE PREGNANCY: PREG TEST UR: NEGATIVE

## 2016-07-03 MED ORDER — HYOSCYAMINE SULFATE 0.125 MG SL SUBL
0.1250 mg | SUBLINGUAL_TABLET | SUBLINGUAL | 0 refills | Status: AC | PRN
Start: 2016-07-03 — End: ?

## 2016-07-03 MED ORDER — ONDANSETRON HCL 4 MG PO TABS: 4.0000 mg | ORAL_TABLET | Freq: Three times a day (TID) | ORAL | 0 refills | Status: DC | PRN

## 2016-07-03 NOTE — Patient Instructions (Addendum)
IF you received an x-ray today, you will receive an invoice from [facility]. Please contact [facility] at [phone] with questions or concerns regarding your invoice.   IF you received labwork today, you will receive an invoice from [place]. Please contact [facility] at [phone] with questions or concerns regarding your invoice.   Our billing staff will not be able to assist you with questions regarding bills from these companies.  You will be contacted with the lab results as soon as they are available. The fastest way to get your results is to activate your My Chart account. Instructions are located on the last page of this paperwork. If you have not heard from [place] regarding the results in 2 weeks, please contact this office.     Nausea and Vomiting, Adult Feeling sick to your stomach (nausea) means that your stomach is upset or you feel like you have to throw up (vomit). Feeling more and more sick to your stomach can lead to throwing up. Throwing up happens when food and liquid from your stomach are thrown up and out the mouth. Throwing up can make you feel weak and cause you to get dehydrated. Dehydration can make you tired and thirsty, make you have a dry mouth, and make it so you pee (urinate) less often. Older adults and people with other diseases or a weak defense system (immune system) are at higher risk for dehydration. If you feel sick to your stomach or if you throw up, it is important to follow instructions from your doctor about how to take care of yourself. Follow these instructions at home: Eating and drinking  Follow these instructions as told by your doctor:  Take an oral rehydration solution (ORS). This is a drink that is sold at pharmacies and stores.  Drink clear fluids in small amounts as you are able, such as:  Water.  Ice chips.  Diluted fruit juice.  Low-calorie sports drinks.  Eat bland, easy-to-digest foods in small amounts as  you are able, such as:  Bananas.  Applesauce.  Rice.  Low-fat (lean) meats.  Toast.  Crackers.  Avoid fluids that have a lot of sugar or caffeine in them.  Avoid alcohol.  Avoid spicy or fatty foods. General instructions   Drink enough fluid to keep your pee (urine) clear or pale yellow.  Wash your hands often. If you cannot use soap and water, use hand sanitizer.  Make sure that all people in your home wash their hands well and often.  Take over-the-counter and prescription medicines only as told by your doctor.  Rest at home while you get better.  Watch your condition for any changes.  Breathe slowly and deeply when you feel sick to your stomach.  Keep all follow-up visits as told by your doctor. This is important. Contact a doctor if:  You have a fever.  You cannot keep fluids down.  Your symptoms get worse.  You have new symptoms.  You feel sick to your stomach for more than two days.  You feel light-headed or dizzy.  You have a headache.  You have muscle cramps. Get help right away if:  You have pain in your chest, neck, arm, or jaw.  You feel very weak or you pass out (faint).  You throw up again and again.  You see blood in your throw-up.  Your throw-up looks like black coffee grounds.  You have bloody or black poop (stools) or poop that look like tar.  You have a very bad headache, a stiff neck, or both.  You have a rash.  You have very bad pain, cramping, or bloating in your belly (abdomen).  You have trouble breathing.  You are breathing very quickly.  Your heart is beating very quickly.  Your skin feels cold and clammy.  You feel confused.  You have pain when you pee.  You have signs of dehydration, such as:  Dark pee, hardly any pee, or no pee.  Cracked lips.  Dry mouth.  Sunken eyes.  Sleepiness.  Weakness. These symptoms may be an emergency. Do not wait to see if the symptoms will go away. Get medical help  right away. Call your local emergency services (911 in the [place] Do not drive yourself to the hospital. This information is not intended to replace advice given to you by your health care provider. Make sure you discuss any questions you have with your health care provider. Document Released: 08/21/2007 Document Revised: 09/22/2015 Document Reviewed: 11/08/2014 Elsevier Interactive Patient Education  2017 [employer].

## 2016-07-03 NOTE — Progress Notes (Signed)
Molly Schultz 26 y.o.   Chief Complaint  Patient presents with  . Emesis  . Irregular Bowel Movements    HISTORY OF PRESENT ILLNESS: This is a 26 y.o. female complaining of nausea and vomiting with crampy abdominal pain on and off for several months.  Emesis   This is a recurrent problem. The current episode started more than 1 month ago. The problem occurs 2 to 4 times per day. The problem has been waxing and waning. The emesis has an appearance of bile. There has been no fever. Associated symptoms include abdominal pain and diarrhea. Pertinent negatives include no arthralgias, chest pain, chills, coughing, dizziness, fever, headaches, myalgias, sweats, URI or weight loss. She has tried diet change for the symptoms. The treatment provided no relief.     Prior to Admission medications   Medication Sig Start Date End Date Taking? Authorizing Provider  BIOTIN PO Take by mouth.   Yes Historical Provider, MD  Fluticasone Propionate (FLONASE NA) Place into the nose.   Yes Historical Provider, MD  Cetirizine HCl (ZYRTEC PO) Take by mouth.    Historical Provider, MD  Cholecalciferol (VITAMIN D PO) Take 1 capsule by mouth every 7 (seven) days.    Historical Provider, MD  hyoscyamine (LEVSIN SL) 0.125 MG SL tablet Place 1 tablet (0.125 mg total) under the tongue every 4 (four) hours as needed. 07/03/16   Georgina Quint, MD  levonorgestrel-ethinyl estradiol (AVIANE) 0.1-20 MG-MCG tablet Take 1 tablet by mouth daily. Patient not taking: Reported on 07/03/2016 11/16/15   Morrell Riddle, PA-C  ondansetron (ZOFRAN) 4 MG tablet Take 1 tablet (4 mg total) by mouth every 8 (eight) hours as needed for nausea or vomiting. 07/03/16   Georgina Quint, MD    No Known Allergies  There are no active problems to display for this patient.   No past medical history on file.  No past surgical history on file.  Social History   Social History  . Marital status: Single    Spouse name: N/A  .  Number of children: N/A  . Years of education: N/A   Occupational History  . Not on file.   Social History Main Topics  . Smoking status: Never Smoker  . Smokeless tobacco: Never Used  . Alcohol use Yes     Comment: socially  . Drug use: No  . Sexual activity: Yes    Birth control/ protection: Implant   Other Topics Concern  . Not on file   Social History Narrative  . No narrative on file    No family history on file.   Review of Systems  Constitutional: Negative for chills, fever, malaise/fatigue and weight loss.  HENT: Negative for congestion, ear pain, hearing loss, nosebleeds and sore throat.   Eyes: Negative.   Respiratory: Negative for cough, shortness of breath and wheezing.   Cardiovascular: Negative for chest pain, palpitations and leg swelling.  Gastrointestinal: Positive for abdominal pain, constipation, diarrhea, nausea and vomiting. Negative for blood in stool, heartburn and melena.  Genitourinary: Negative for dysuria and hematuria.  Musculoskeletal: Negative for arthralgias, back pain, myalgias and neck pain.  Skin: Negative for rash.  Neurological: Negative for dizziness and headaches.  Endo/Heme/Allergies: Negative.   All other systems reviewed and are negative.  Vitals:   07/03/16 0953  BP: 110/71  Pulse: 89  Resp: 18  Temp: 99.1 F (37.3 C)     Physical Exam  Constitutional: She is oriented to person, place, and time. She appears  well-developed and well-nourished.  HENT:  Head: Normocephalic and atraumatic.  Nose: Nose normal.  Mouth/Throat: Oropharynx is clear and moist. No oropharyngeal exudate.  Eyes: Conjunctivae and EOM are normal. Pupils are equal, round, and reactive to light.  Neck: Normal range of motion. Neck supple. No JVD present. No thyromegaly present.  Cardiovascular: Normal rate, regular rhythm, normal heart sounds and intact distal pulses.   Pulmonary/Chest: Effort normal and breath sounds normal.  Abdominal: Soft. Bowel  sounds are normal. She exhibits no distension and no mass. There is no tenderness. There is no guarding.  Musculoskeletal: Normal range of motion.  Lymphadenopathy:    She has no cervical adenopathy.  Neurological: She is alert and oriented to person, place, and time. No sensory deficit. She exhibits normal muscle tone.  Skin: Skin is warm and dry. Capillary refill takes less than 2 seconds. No rash noted.  Psychiatric: She has a normal mood and affect. Her behavior is normal.  Vitals reviewed.  Results for orders placed or performed in visit on 07/03/16 (from the past 24 hour(s))  POCT urinalysis dipstick     Status: None   Collection Time: 07/03/16 10:31 AM  Result Value Ref Range   Color, UA yellow yellow   Clarity, UA clear clear   Glucose, UA negative negative mg/dL   Bilirubin, UA negative negative   Ketones, POC UA negative negative mg/dL   Spec Grav, UA 4.010 2.725 - 1.025   Blood, UA negative negative   pH, UA 6.0 5.0 - 8.0   Protein Ur, POC negative negative mg/dL   Urobilinogen, UA 0.2 0.2 or 1.0 E.U./dL   Nitrite, UA Negative Negative   Leukocytes, UA Negative Negative  POCT urine pregnancy     Status: None   Collection Time: 07/03/16 10:31 AM  Result Value Ref Range   Preg Test, Ur Negative Negative     ASSESSMENT & PLAN: Marveline was seen today for emesis and irregular bowel movements.  Diagnoses and all orders for this visit:  Nausea and vomiting, intractability of vomiting not specified, unspecified vomiting type -     CBC with Differential/Platelet -     Comprehensive metabolic panel -     POCT urinalysis dipstick -     POCT urine pregnancy -     Ambulatory referral to Gastroenterology  Generalized abdominal pain -     CBC with Differential/Platelet -     Comprehensive metabolic panel -     POCT urinalysis dipstick -     POCT urine pregnancy -     Ambulatory referral to Gastroenterology  Other orders -     ondansetron (ZOFRAN) 4 MG tablet; Take 1  tablet (4 mg total) by mouth every 8 (eight) hours as needed for nausea or vomiting. -     hyoscyamine (LEVSIN SL) 0.125 MG SL tablet; Place 1 tablet (0.125 mg total) under the tongue every 4 (four) hours as needed.    Patient Instructions       IF you received an x-ray today, you will receive an invoice from [facility] Radiology. Please contact [facility] Radiology at [phone] with questions or concerns regarding your invoice.   IF you received labwork today, you will receive an invoice from [place]. Please contact [facility] at [phone] with questions or concerns regarding your invoice.   Our billing staff will not be able to assist you with questions regarding bills from these companies.  You will be contacted with the lab results as soon as they are available. The  fastest way to get your results is to activate your My Chart account. Instructions are located on the last page of this paperwork. If you have not heard from [place] regarding the results in 2 weeks, please contact this office.     Nausea and Vomiting, Adult Feeling sick to your stomach (nausea) means that your stomach is upset or you feel like you have to throw up (vomit). Feeling more and more sick to your stomach can lead to throwing up. Throwing up happens when food and liquid from your stomach are thrown up and out the mouth. Throwing up can make you feel weak and cause you to get dehydrated. Dehydration can make you tired and thirsty, make you have a dry mouth, and make it so you pee (urinate) less often. Older adults and people with other diseases or a weak defense system (immune system) are at higher risk for dehydration. If you feel sick to your stomach or if you throw up, it is important to follow instructions from your doctor about how to take care of yourself. Follow these instructions at home: Eating and drinking  Follow these instructions as told by your doctor:  Take an oral rehydration solution (ORS). This  is a drink that is sold at pharmacies and stores.  Drink clear fluids in small amounts as you are able, such as:  Water.  Ice chips.  Diluted fruit juice.  Low-calorie sports drinks.  Eat bland, easy-to-digest foods in small amounts as you are able, such as:  Bananas.  Applesauce.  Rice.  Low-fat (lean) meats.  Toast.  Crackers.  Avoid fluids that have a lot of sugar or caffeine in them.  Avoid alcohol.  Avoid spicy or fatty foods. General instructions   Drink enough fluid to keep your pee (urine) clear or pale yellow.  Wash your hands often. If you cannot use soap and water, use hand sanitizer.  Make sure that all people in your home wash their hands well and often.  Take over-the-counter and prescription medicines only as told by your doctor.  Rest at home while you get better.  Watch your condition for any changes.  Breathe slowly and deeply when you feel sick to your stomach.  Keep all follow-up visits as told by your doctor. This is important. Contact a doctor if:  You have a fever.  You cannot keep fluids down.  Your symptoms get worse.  You have new symptoms.  You feel sick to your stomach for more than two days.  You feel light-headed or dizzy.  You have a headache.  You have muscle cramps. Get help right away if:  You have pain in your chest, neck, arm, or jaw.  You feel very weak or you pass out (faint).  You throw up again and again.  You see blood in your throw-up.  Your throw-up looks like black coffee grounds.  You have bloody or black poop (stools) or poop that look like tar.  You have a very bad headache, a stiff neck, or both.  You have a rash.  You have very bad pain, cramping, or bloating in your belly (abdomen).  You have trouble breathing.  You are breathing very quickly.  Your heart is beating very quickly.  Your skin feels cold and clammy.  You feel confused.  You have pain when you pee.  You have  signs of dehydration, such as:  Dark pee, hardly any pee, or no pee.  Cracked lips.  Dry mouth.  Sunken  eyes.  Sleepiness.  Weakness. These symptoms may be an emergency. Do not wait to see if the symptoms will go away. Get medical help right away. Call your local emergency services (911 in the [place] Do not drive yourself to the hospital. This information is not intended to replace advice given to you by your health care provider. Make sure you discuss any questions you have with your health care provider. Document Released: 08/21/2007 Document Revised: 09/22/2015 Document Reviewed: 11/08/2014 Elsevier Interactive Patient Education  2017 [employer] Inc[employer]      Edwina Barth, MD [facility]

## 2016-07-12 ENCOUNTER — Other Ambulatory Visit: Payer: Self-pay | Admitting: Gastroenterology

## 2016-07-12 DIAGNOSIS — R1011 Right upper quadrant pain: Secondary | ICD-10-CM

## 2016-07-22 ENCOUNTER — Ambulatory Visit
Admission: RE | Admit: 2016-07-22 | Discharge: 2016-07-22 | Disposition: A | Discharge status: Home / Self Care | Payer: PRIVATE HEALTH INSURANCE | Source: Ambulatory Visit | Attending: Gastroenterology | Admitting: Gastroenterology

## 2016-07-22 DIAGNOSIS — R1011 Right upper quadrant pain: Secondary | ICD-10-CM

## 2016-08-19 IMAGING — CT CT HEAD W/O CM
1 series · 16 of 30 positions shown, 20 images · non-contrast
Comparison: None.

CLINICAL DATA: Posttraumatic headache after fall out of shower this
morning.

EXAM:
CT HEAD WITHOUT CONTRAST
TECHNIQUE: Contiguous axial images were obtained from the base of the skull
through the vertex without intravenous contrast.

[Series 2: head 5.0 h30s · axial · 0.44mm/px · z∈[-128,+7]mm · 16 of 31 slices shown, 20 images]
[im 2/31  brain]
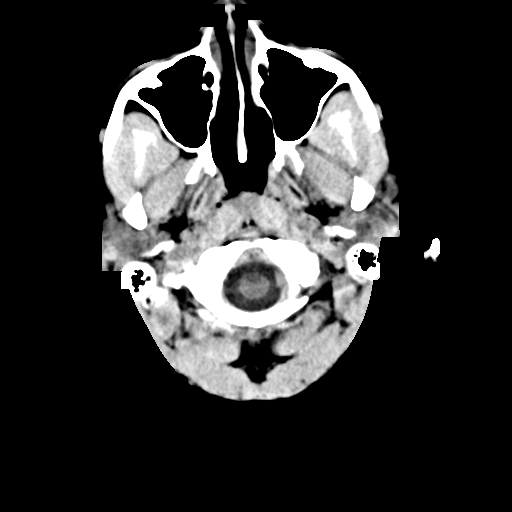
[im 2/31  bone]
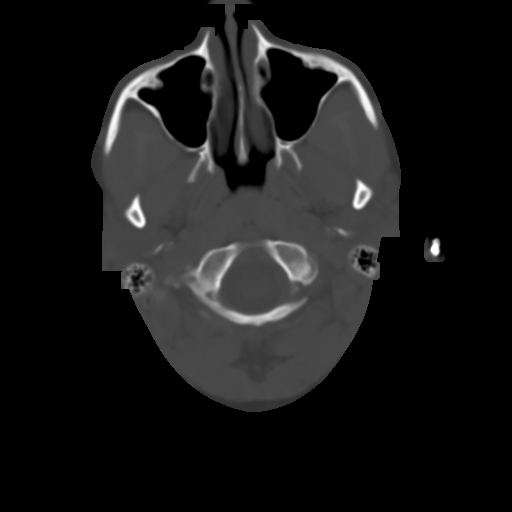
[im 4/31  brain]
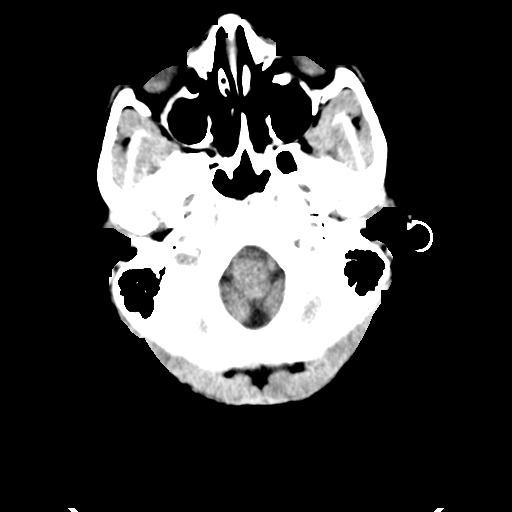
[im 6/31  brain]
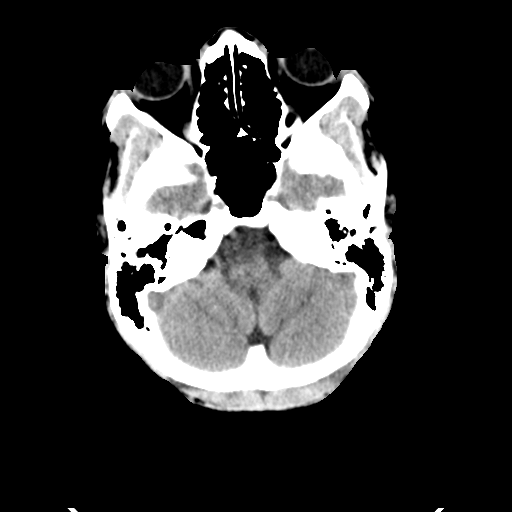
[im 8/31  brain]
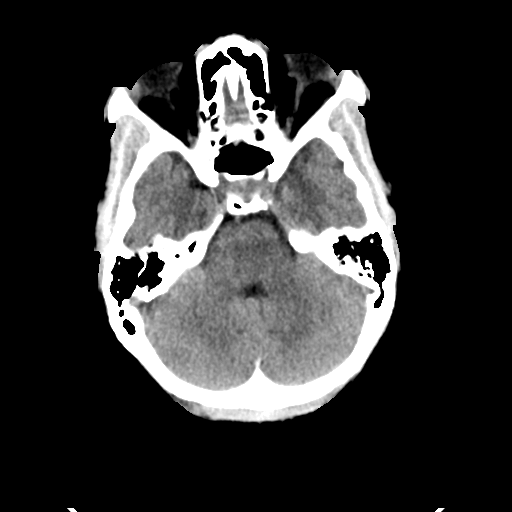
[im 9/31  brain]
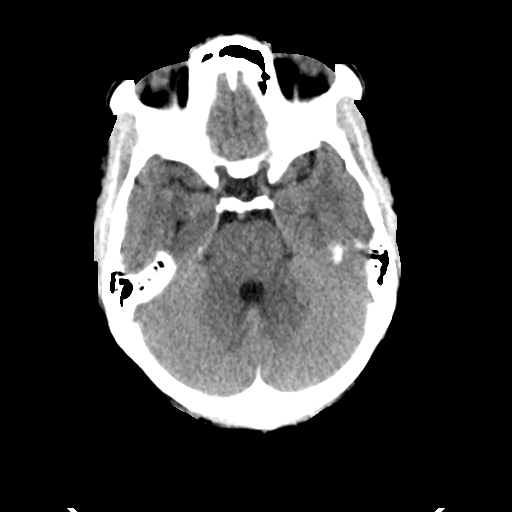
[im 9/31  bone]
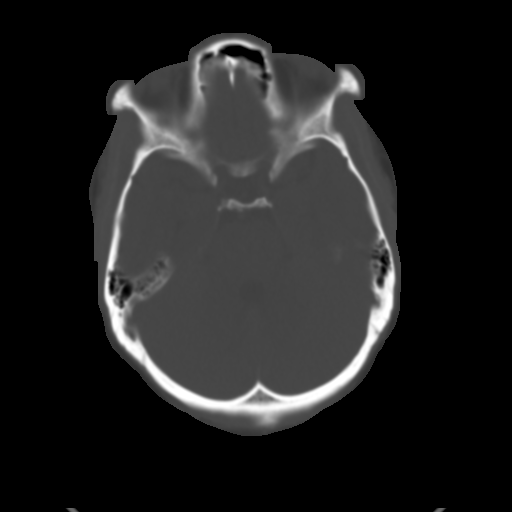
[im 11/31  brain]
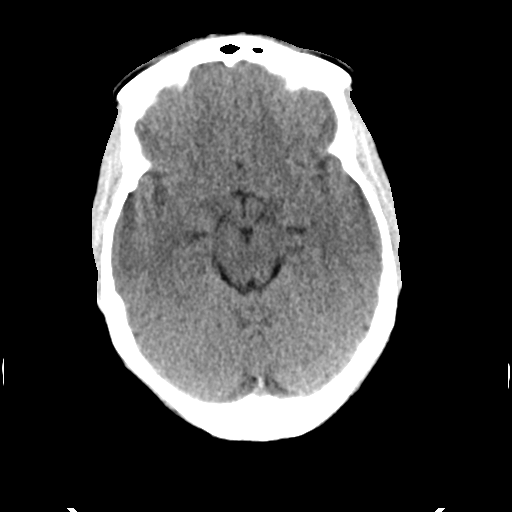
[im 13/31  brain]
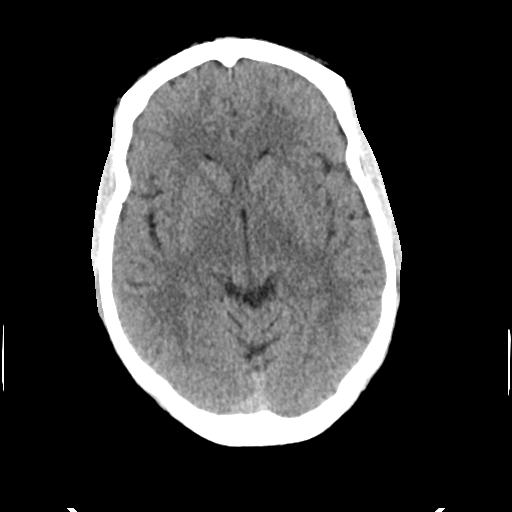
[im 15/31  brain]
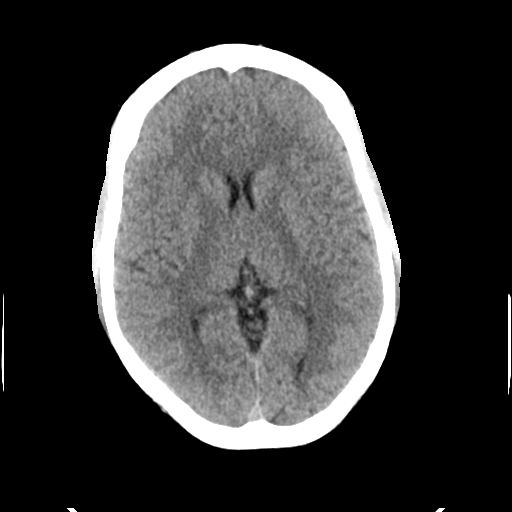
[im 16/31  brain]
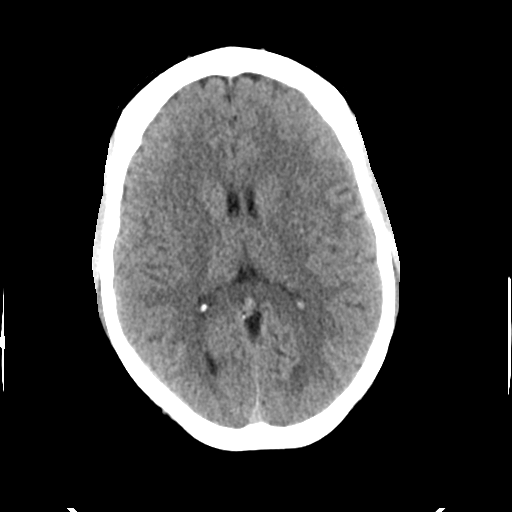
[im 16/31  bone]
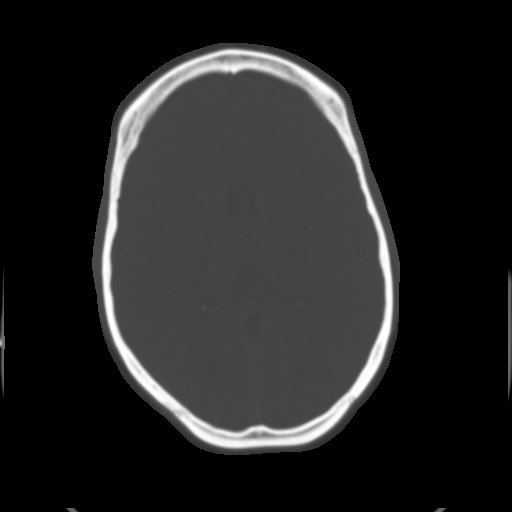
[im 18/31  brain]
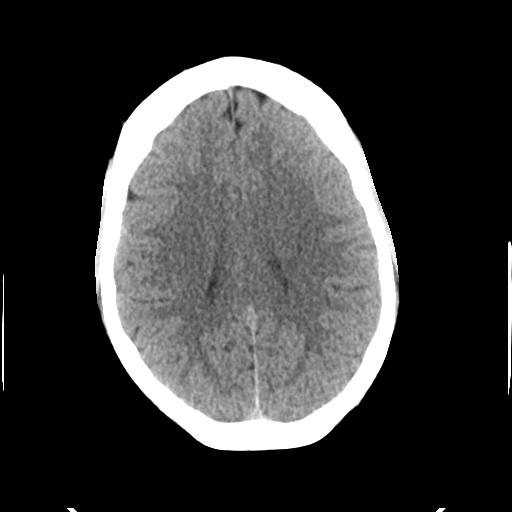
[im 20/31  brain]
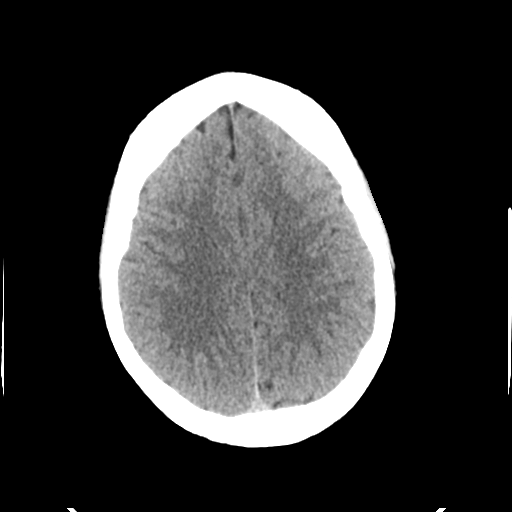
[im 22/31  brain]
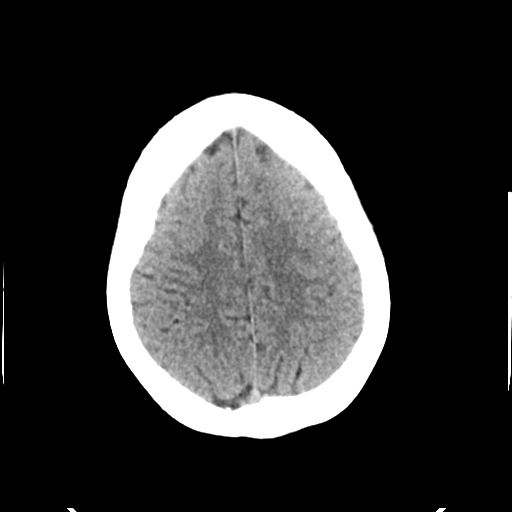
[im 23/31  brain]
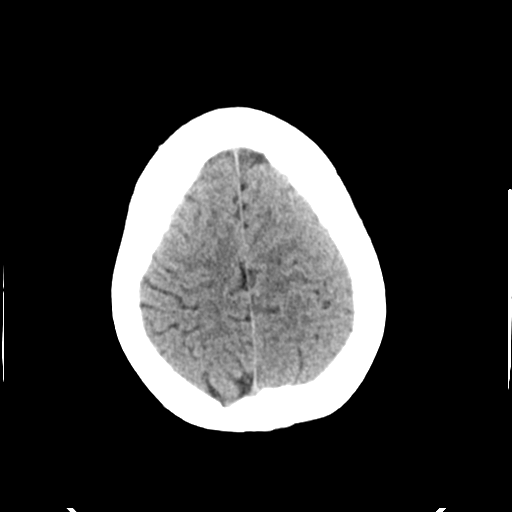
[im 23/31  bone]
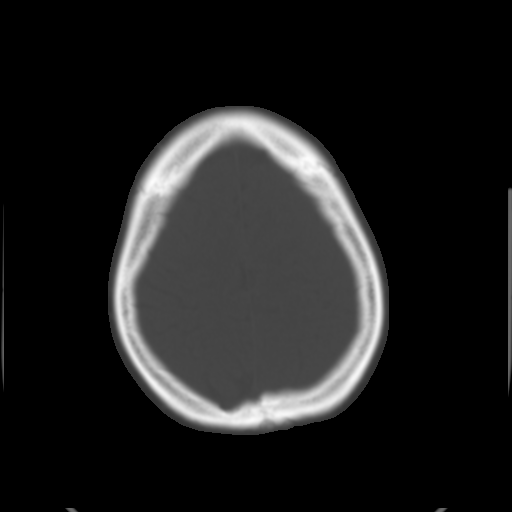
[im 25/31  brain]
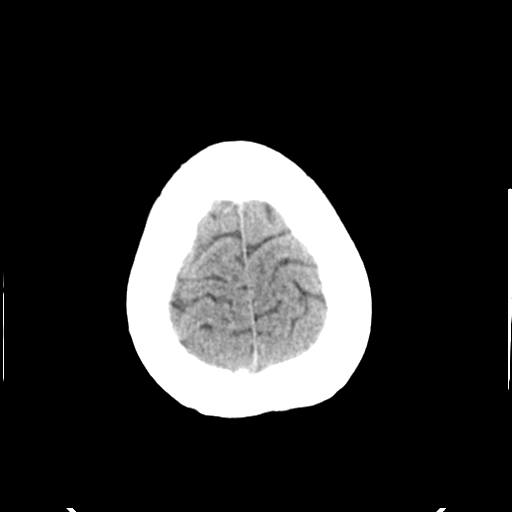
[im 27/31  brain]
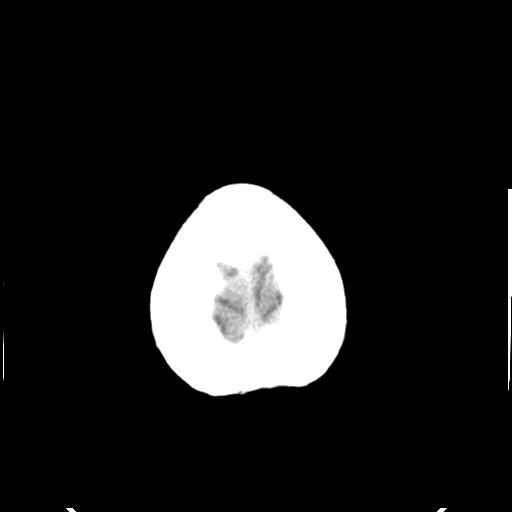
[im 29/31  brain]
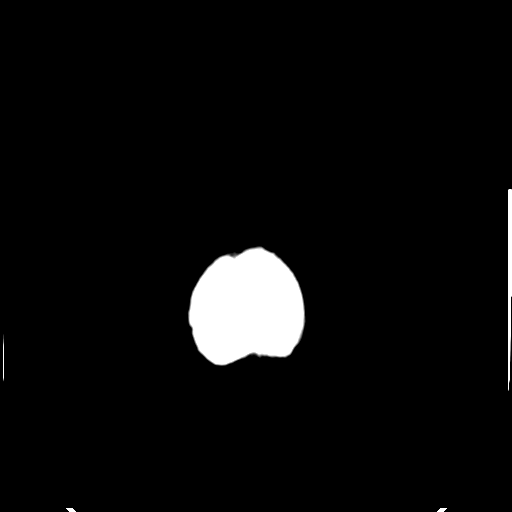

[16 of 30 positions shown; findings below may reference images not displayed]

FINDINGS: Bony calvarium appears intact. No mass effect or midline shift is
noted. Ventricular size is within normal limits. There is no
evidence of mass lesion, hemorrhage or acute infarction.
IMPRESSION: Normal head CT.

## 2017-06-03 ENCOUNTER — Encounter (HOSPITAL_BASED_OUTPATIENT_CLINIC_OR_DEPARTMENT_OTHER): Payer: Self-pay

## 2017-06-03 ENCOUNTER — Other Ambulatory Visit: Payer: Self-pay

## 2017-06-03 ENCOUNTER — Emergency Department (HOSPITAL_BASED_OUTPATIENT_CLINIC_OR_DEPARTMENT_OTHER)
Admission: EM | Admit: 2017-06-03 | Discharge: 2017-06-03 | Disposition: A | Discharge status: Home / Self Care | Payer: PRIVATE HEALTH INSURANCE | Attending: Emergency Medicine | Admitting: Emergency Medicine

## 2017-06-03 DIAGNOSIS — R2232 Localized swelling, mass and lump, left upper limb: Secondary | ICD-10-CM | POA: Insufficient documentation

## 2017-06-03 DIAGNOSIS — M79645 Pain in left finger(s): Secondary | ICD-10-CM | POA: Insufficient documentation

## 2017-06-03 HISTORY — DX: Irritable bowel syndrome without diarrhea: K58.9

## 2017-06-03 HISTORY — DX: Irritable bowel syndrome, unspecified: K58.9

## 2017-06-03 MED ORDER — CEPHALEXIN 500 MG PO CAPS: 500.0000 mg | ORAL_CAPSULE | Freq: Four times a day (QID) | ORAL | 0 refills | Status: AC

## 2017-06-03 NOTE — Discharge Instructions (Signed)
If your symptoms are not improving over the next few hours, begin taking Keflex.  Use ice 3-4 times daily alternating 15 minutes on, 15 minutes off to help with the swelling.  Please return to the emergency department immediately if you develop any new or worsening symptoms, or if your symptoms are not improving over the next 2 days.

## 2017-06-03 NOTE — ED Triage Notes (Addendum)
Pt states she woke 3 days ago with swelling to left index finger-pt has silver band ring in place/unable to remove-pt NAD-steady gait

## 2017-06-03 NOTE — ED Provider Notes (Signed)
[facility] EMERGENCY DEPARTMENT Provider Note   CSN: [id number] Arrival date & time: 06/03/17  1905     History   Chief Complaint Chief Complaint  Patient presents with  . Hand Pain    index finger swelling    HPI Molly Schultz is a 27 y.o. female with history of IBS who presents with a 3-day history of left index finger swelling after she changed her ring.  The ring is not new, however she does not usually wear it on that finger.  She reports going to bed and waking up with her finger painful and swollen.  She had associated paresthesias to the finger.  On my exam, the ring has been cut off by staff and patient has had improvement of symptoms.  She denies getting bit by anything or injuring it.  She did not try anything at home, except trying to get the ring off.  HPI  Past Medical History:  Diagnosis Date  . IBS (irritable bowel syndrome)     Patient Active Problem List   Diagnosis Date Noted  . Nausea and vomiting 07/03/2016  . Generalized abdominal pain 07/03/2016    History reviewed. No pertinent surgical history.  OB History    No data available       Home Medications    Prior to Admission medications   Medication Sig Start Date End Date Taking? Authorizing Provider  cephALEXin (KEFLEX) 500 MG capsule Take 1 capsule (500 mg total) by mouth 4 (four) times daily. 06/03/17   Radiance Deady, Waylan BogaAlexandra M, PA-C  hyoscyamine (LEVSIN SL) 0.125 MG SL tablet Place 1 tablet (0.125 mg total) under the tongue every 4 (four) hours as needed. 07/03/16   Georgina QuintSagardia, Miguel Jose, MD    Family History No family history on file.  Social History Social History   Tobacco Use  . Smoking status: Never Smoker  . Smokeless tobacco: Never Used  Substance Use Topics  . Alcohol use: Yes    Comment: occ  . Drug use: No     Allergies   Patient has no known allergies.   Review of Systems Review of Systems  Constitutional: Negative for fever.  Musculoskeletal: Positive for  arthralgias and joint swelling.  Skin: Positive for color change.  Neurological: Positive for numbness (paresthesia).     Physical Exam Updated Vital Signs BP 119/78 (BP Location: Left Arm)   Pulse 96   Temp 98.4 F (36.9 C) (Oral)   Resp 18   Ht 5\' 4"  (1.626 m)   Wt 63.6 kg (140 lb 4.8 oz)   LMP 05/09/2017   SpO2 100%   BMI 24.08 kg/m   Physical Exam  Constitutional: She appears well-developed and well-nourished. No distress.  HENT:  Head: Normocephalic and atraumatic.  Mouth/Throat: Oropharynx is clear and moist. No oropharyngeal exudate.  Eyes: Conjunctivae are normal. Pupils are equal, round, and reactive to light. Right eye exhibits no discharge. Left eye exhibits no discharge. No scleral icterus.  Neck: Normal range of motion. Neck supple. No thyromegaly present.  Cardiovascular: Normal rate, regular rhythm, normal heart sounds and intact distal pulses. Exam reveals no gallop and no friction rub.  No murmur heard. Pulmonary/Chest: Effort normal and breath sounds normal. No stridor. No respiratory distress. She has no wheezes. She has no rales.  Musculoskeletal: She exhibits no edema.  Edema, warmth, and erythema to the MCP of left index finger, sensation intact, full range of motion of flexion, extension of the finger; see photo  Lymphadenopathy:  She has no cervical adenopathy.  Neurological: She is alert. Coordination normal.  Skin: Skin is warm and dry. No rash noted. She is not diaphoretic. No pallor.  Psychiatric: She has a normal mood and affect.  Nursing note and vitals reviewed.        ED Treatments / Results  Labs (all labs ordered are listed, but only abnormal results are displayed) Labs Reviewed - No data to display  EKG  EKG Interpretation None       Radiology No results found.  Procedures Procedures (including critical care time)  Medications Ordered in ED Medications - No data to display   Initial Impression / Assessment and  Plan / ED Course  I have reviewed the triage vital signs and the nursing notes.  Pertinent labs & imaging results that were available during my care of the patient were reviewed by me and considered in my medical decision making (see chart for details).     Patient symptoms could solely be related to the ring being stuck, however considering warmth and erythema, will cover with Keflex if finger does not return back to normal over the next few hours now that ring has been removed.  No signs of skin disruption or breakdown at this time.  No signs of tenosynovitis.  No injury indicating x-ray at this time.  Patient given strict return precautions if symptoms are not improving.  Patient understands and agrees with plan.  Patient vitals stable throughout ED course and discharged in satisfactory condition.  Final Clinical Impressions(s) / ED Diagnoses   Final diagnoses:  Finger pain, left    ED Discharge Orders        Ordered    cephALEXin (KEFLEX) 500 MG capsule  4 times daily     06/03/17 2047       Emi Holes, PA-C 06/03/17 2147    Tegeler, Canary Brim, MD 06/04/17 0025

## 2017-06-03 NOTE — ED Notes (Signed)
Pt verbalizes understanding of d/c instructions and denies any further needs at this time. 

## 2018-10-31 IMAGING — US US ABDOMEN LIMITED
1 series · 14 of 25 positions shown · non-contrast
Comparison: None.

CLINICAL DATA: Right upper quadrant pain

EXAM:
US ABDOMEN LIMITED - RIGHT UPPER QUADRANT

[Series 1: us abdomen limited · 0.17mm/px · 14 of 29 slices shown]
[im 1/29]
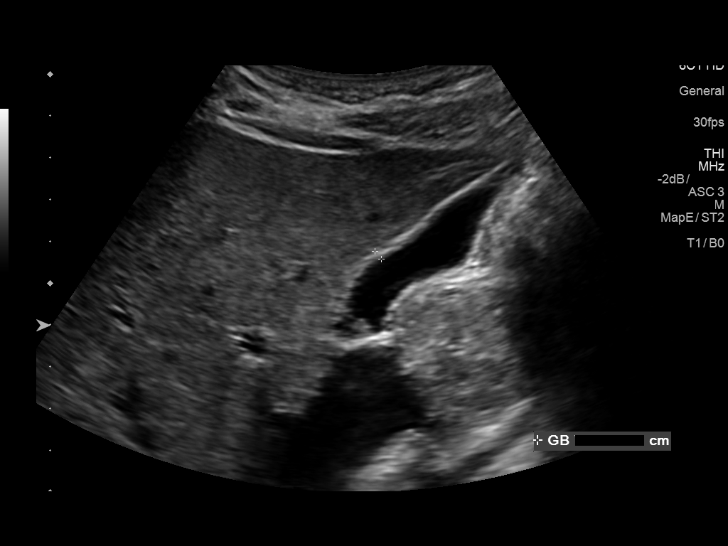
[im 3/29]
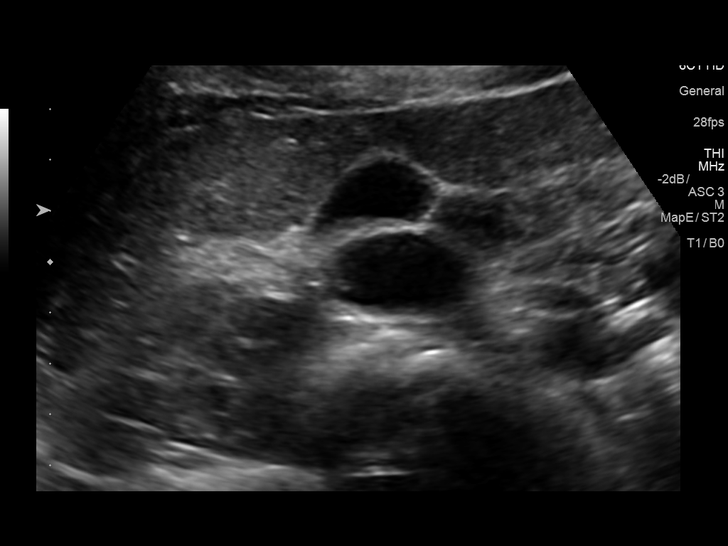
[im 5/29]
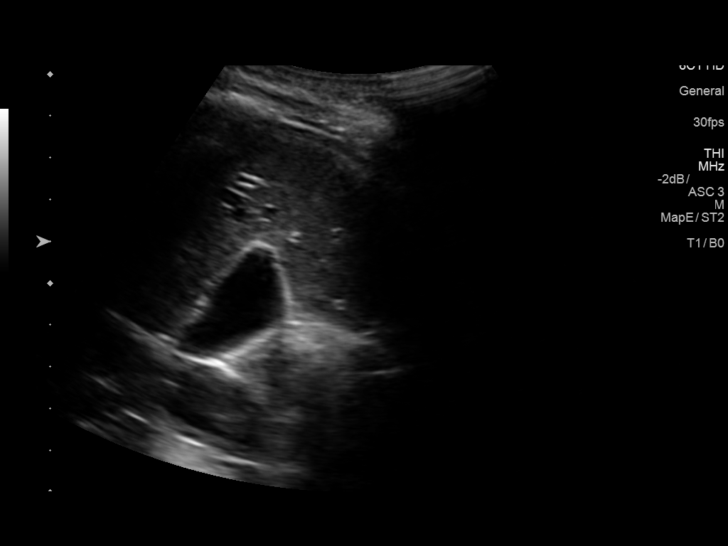
[im 8/29]
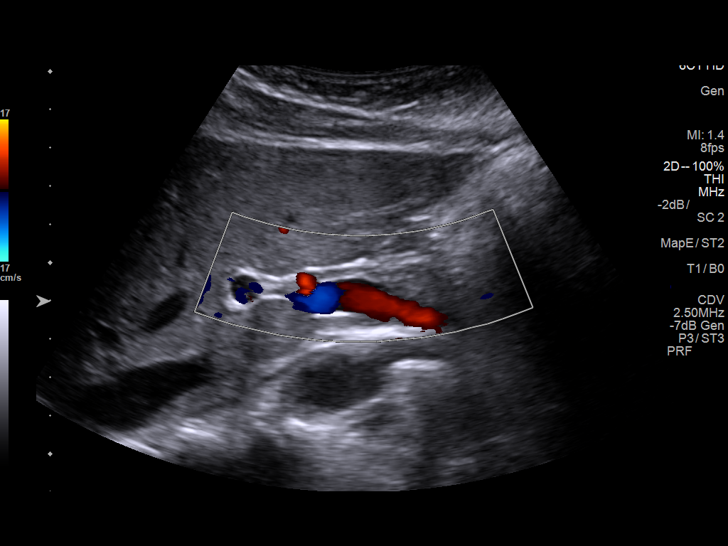
[im 10/29]
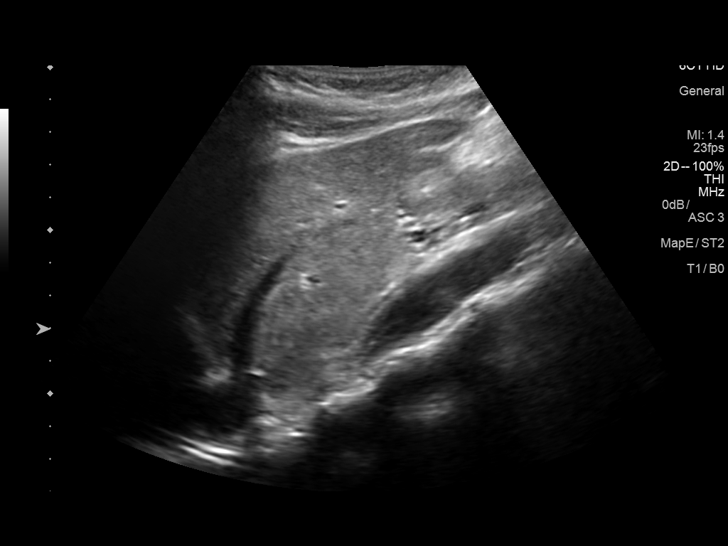
[im 11/29]
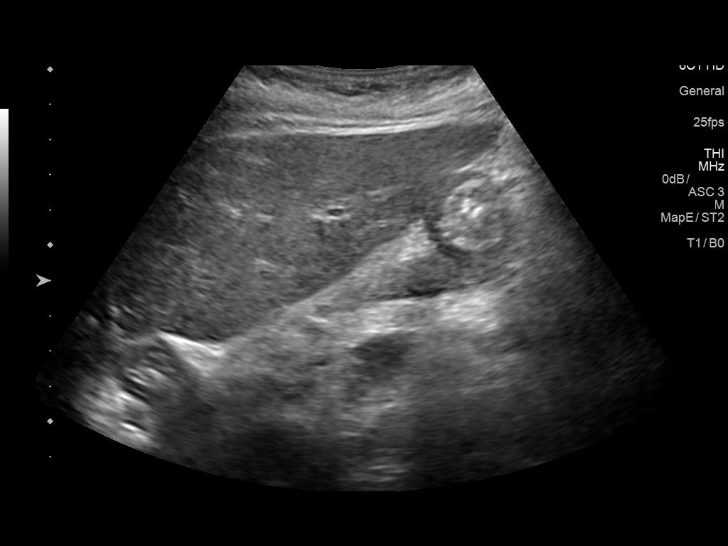
[im 13/29]
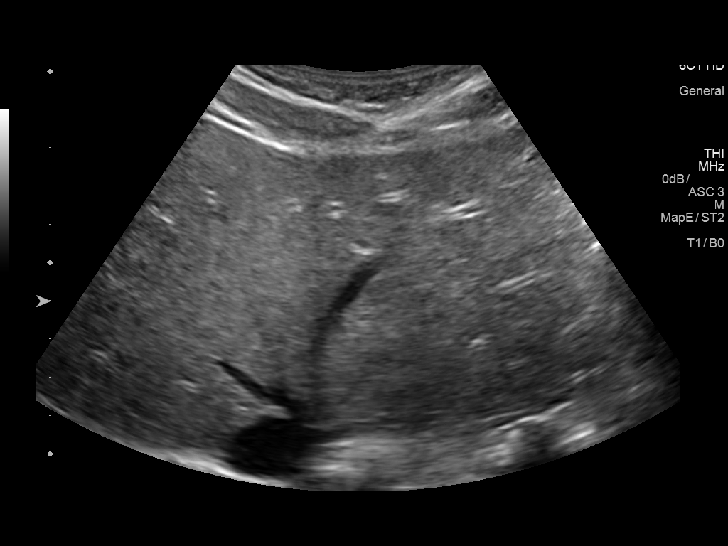
[im 16/29]
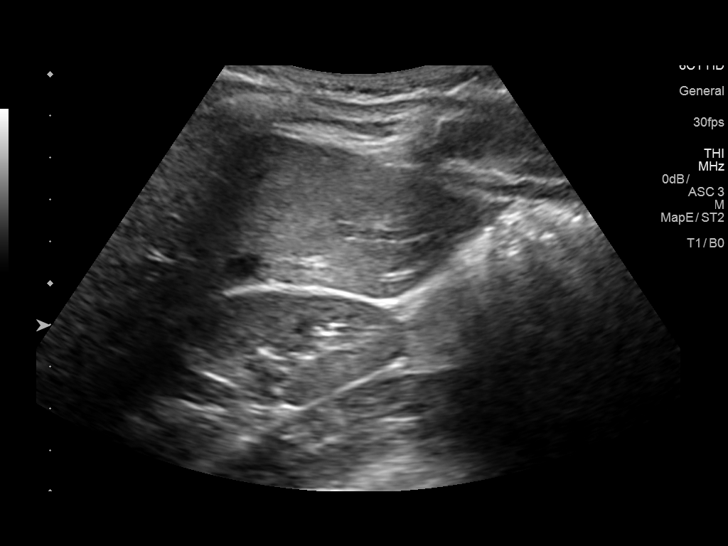
[im 18/29]
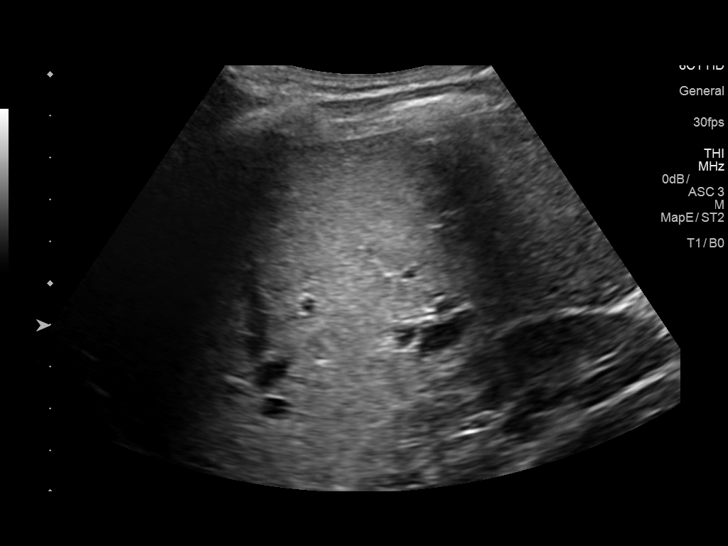
[im 19/29]
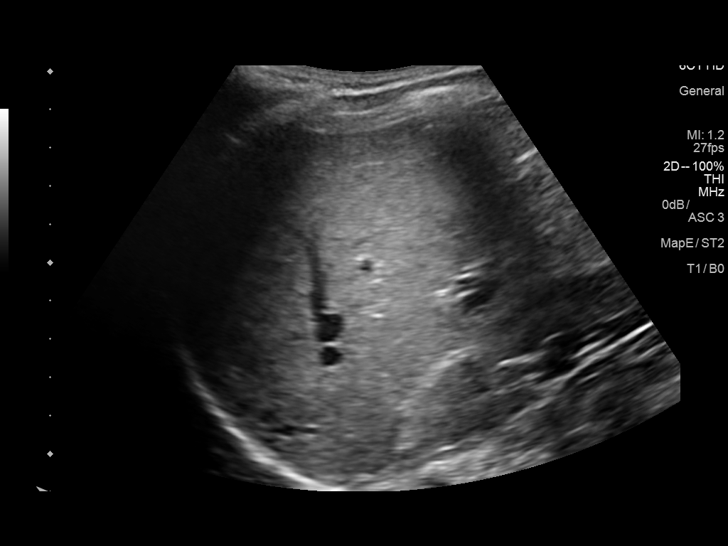
[im 22/29]
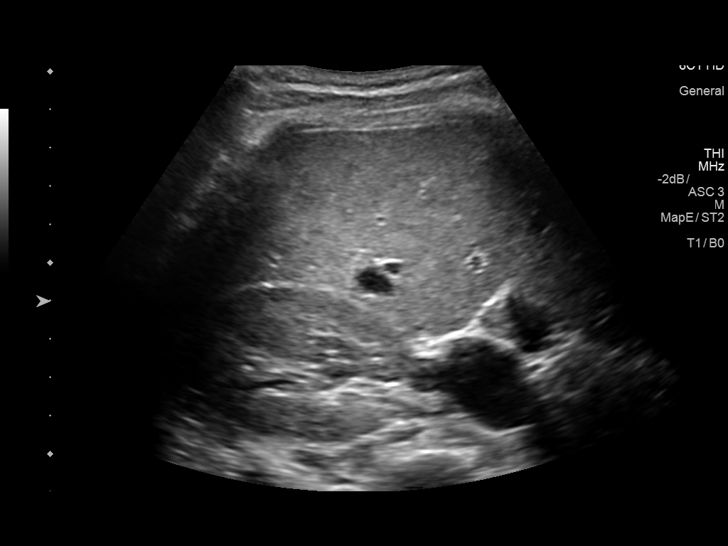
[im 24/29]
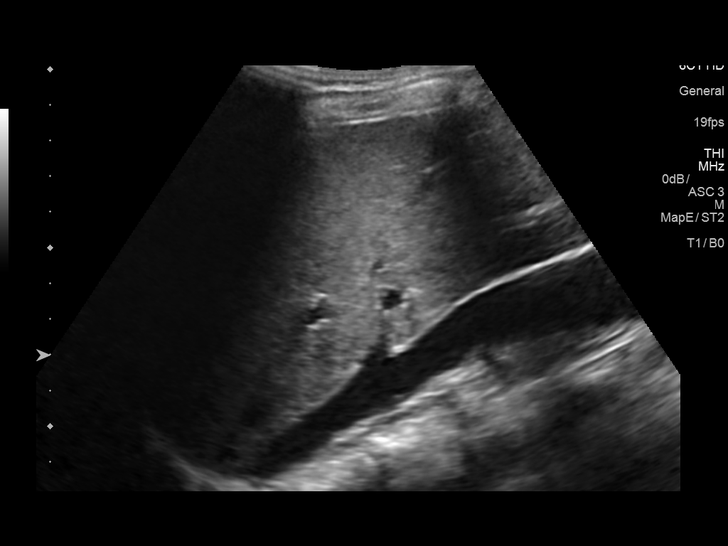
[im 26/29]
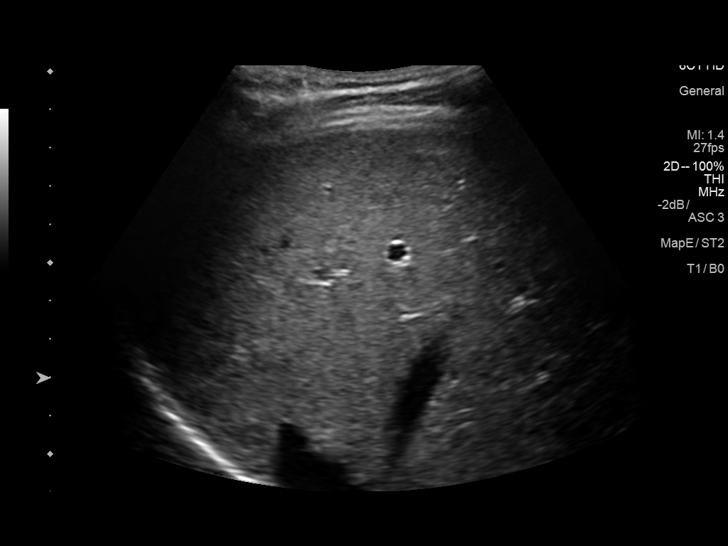
[im 29/29]
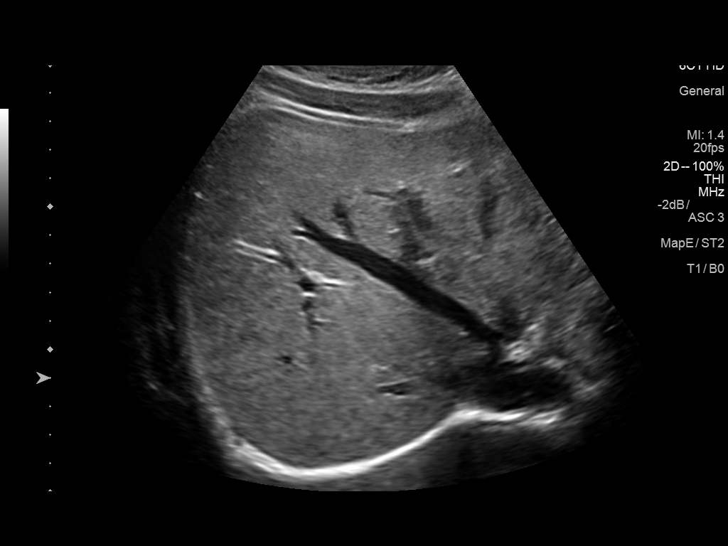

[14 of 25 positions shown; findings below may reference images not displayed]

FINDINGS: Gallbladder:

No gallstones or wall thickening visualized. No sonographic Murphy
sign noted by sonographer.

Common bile duct:

Diameter: Normal caliber, 2 mm

Liver:

No focal lesion identified. Within normal limits in parenchymal
echogenicity.
IMPRESSION: Normal right upper quadrant ultrasound.
